# Patient Record
Sex: Female | Born: 1985
Health system: Southern US, Community
[De-identification: ages and names within clinical notes are randomized; demographics above are authoritative.]

## PROBLEM LIST (undated history)

## (undated) ENCOUNTER — Inpatient Hospital Stay (HOSPITAL_COMMUNITY): Payer: Self-pay

## (undated) DIAGNOSIS — O139 Gestational [pregnancy-induced] hypertension without significant proteinuria, unspecified trimester: Secondary | ICD-10-CM

## (undated) DIAGNOSIS — Z789 Other specified health status: Secondary | ICD-10-CM

## (undated) DIAGNOSIS — I10 Essential (primary) hypertension: Secondary | ICD-10-CM

## (undated) DIAGNOSIS — Z87891 Personal history of nicotine dependence: Secondary | ICD-10-CM

## (undated) DIAGNOSIS — G4730 Sleep apnea, unspecified: Secondary | ICD-10-CM

## (undated) DIAGNOSIS — D649 Anemia, unspecified: Secondary | ICD-10-CM

## (undated) DIAGNOSIS — F1091 Alcohol use, unspecified, in remission: Secondary | ICD-10-CM

## (undated) HISTORY — PX: DILATION AND CURETTAGE OF UTERUS: SHX78

## (undated) HISTORY — DX: Other specified health status: Z78.9

## (undated) HISTORY — DX: Essential (primary) hypertension: I10

## (undated) HISTORY — PX: WISDOM TOOTH EXTRACTION: SHX21

## (undated) HISTORY — DX: Alcohol use, unspecified, in remission: F10.91

## (undated) HISTORY — DX: Personal history of nicotine dependence: Z87.891

---
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
## 2003-10-19 ENCOUNTER — Inpatient Hospital Stay (HOSPITAL_COMMUNITY): Admission: AD | Admit: 2003-10-19 | Discharge: 2003-10-19 | Payer: Self-pay | Admitting: *Deleted

## 2003-11-10 ENCOUNTER — Ambulatory Visit (HOSPITAL_COMMUNITY): Admission: RE | Admit: 2003-11-10 | Discharge: 2003-11-10 | Payer: Self-pay | Admitting: *Deleted

## 2004-06-02 ENCOUNTER — Emergency Department (HOSPITAL_COMMUNITY): Admission: EM | Admit: 2004-06-02 | Discharge: 2004-06-02 | Payer: Self-pay | Admitting: Emergency Medicine

## 2004-10-24 ENCOUNTER — Emergency Department (HOSPITAL_COMMUNITY): Admission: EM | Admit: 2004-10-24 | Discharge: 2004-10-25 | Payer: Self-pay | Admitting: Emergency Medicine

## 2005-08-27 ENCOUNTER — Emergency Department (HOSPITAL_COMMUNITY): Admission: EM | Admit: 2005-08-27 | Discharge: 2005-08-27 | Payer: Self-pay | Admitting: Family Medicine

## 2005-08-27 ENCOUNTER — Emergency Department (HOSPITAL_COMMUNITY): Admission: EM | Admit: 2005-08-27 | Discharge: 2005-08-27 | Payer: Self-pay | Admitting: *Deleted

## 2005-10-18 ENCOUNTER — Emergency Department (HOSPITAL_COMMUNITY): Admission: EM | Admit: 2005-10-18 | Discharge: 2005-10-18 | Payer: Self-pay | Admitting: Emergency Medicine

## 2006-06-05 ENCOUNTER — Emergency Department (HOSPITAL_COMMUNITY): Admission: EM | Admit: 2006-06-05 | Discharge: 2006-06-06 | Payer: Self-pay | Admitting: Emergency Medicine

## 2006-06-09 ENCOUNTER — Emergency Department (HOSPITAL_COMMUNITY): Admission: EM | Admit: 2006-06-09 | Discharge: 2006-06-09 | Payer: Self-pay | Admitting: Emergency Medicine

## 2006-07-04 ENCOUNTER — Ambulatory Visit: Payer: Self-pay | Admitting: Obstetrics and Gynecology

## 2006-07-04 ENCOUNTER — Encounter: Payer: Self-pay | Admitting: Obstetrics and Gynecology

## 2006-11-30 ENCOUNTER — Emergency Department (HOSPITAL_COMMUNITY): Admission: EM | Admit: 2006-11-30 | Discharge: 2006-12-01 | Payer: Self-pay | Admitting: Emergency Medicine

## 2007-02-27 ENCOUNTER — Ambulatory Visit (HOSPITAL_COMMUNITY): Admission: RE | Admit: 2007-02-27 | Discharge: 2007-02-27 | Payer: Self-pay | Admitting: Family Medicine

## 2007-05-07 ENCOUNTER — Inpatient Hospital Stay (HOSPITAL_COMMUNITY): Admission: AD | Admit: 2007-05-07 | Discharge: 2007-05-08 | Payer: Self-pay | Admitting: Family Medicine

## 2007-05-07 ENCOUNTER — Other Ambulatory Visit: Payer: Self-pay

## 2007-05-24 ENCOUNTER — Ambulatory Visit: Payer: Self-pay | Admitting: Oncology

## 2007-06-19 ENCOUNTER — Ambulatory Visit: Payer: Self-pay | Admitting: Obstetrics and Gynecology

## 2007-06-19 ENCOUNTER — Inpatient Hospital Stay (HOSPITAL_COMMUNITY): Admission: AD | Admit: 2007-06-19 | Discharge: 2007-06-19 | Payer: Self-pay | Admitting: Gynecology

## 2007-07-22 ENCOUNTER — Ambulatory Visit: Payer: Self-pay | Admitting: Family Medicine

## 2007-07-22 ENCOUNTER — Inpatient Hospital Stay (HOSPITAL_COMMUNITY): Admission: RE | Admit: 2007-07-22 | Discharge: 2007-07-25 | Payer: Self-pay | Admitting: Family Medicine

## 2007-07-29 ENCOUNTER — Inpatient Hospital Stay (HOSPITAL_COMMUNITY): Admission: AD | Admit: 2007-07-29 | Discharge: 2007-07-29 | Payer: Self-pay | Admitting: Family Medicine

## 2007-08-27 ENCOUNTER — Emergency Department (HOSPITAL_COMMUNITY): Admission: EM | Admit: 2007-08-27 | Discharge: 2007-08-27 | Payer: Self-pay | Admitting: Emergency Medicine

## 2008-02-22 ENCOUNTER — Emergency Department (HOSPITAL_COMMUNITY): Admission: EM | Admit: 2008-02-22 | Discharge: 2008-02-22 | Payer: Self-pay | Admitting: Emergency Medicine

## 2008-08-27 ENCOUNTER — Emergency Department (HOSPITAL_COMMUNITY): Admission: EM | Admit: 2008-08-27 | Discharge: 2008-08-27 | Payer: Self-pay | Admitting: Emergency Medicine

## 2008-09-03 ENCOUNTER — Emergency Department (HOSPITAL_COMMUNITY): Admission: EM | Admit: 2008-09-03 | Discharge: 2008-09-03 | Payer: Self-pay | Admitting: Emergency Medicine

## 2008-09-09 ENCOUNTER — Emergency Department (HOSPITAL_COMMUNITY): Admission: EM | Admit: 2008-09-09 | Discharge: 2008-09-09 | Payer: Self-pay | Admitting: Emergency Medicine

## 2008-09-23 ENCOUNTER — Emergency Department (HOSPITAL_COMMUNITY): Admission: EM | Admit: 2008-09-23 | Discharge: 2008-09-23 | Payer: Self-pay | Admitting: Emergency Medicine

## 2008-10-20 ENCOUNTER — Emergency Department (HOSPITAL_COMMUNITY): Admission: EM | Admit: 2008-10-20 | Discharge: 2008-10-20 | Payer: Self-pay | Admitting: Emergency Medicine

## 2008-12-31 ENCOUNTER — Emergency Department (HOSPITAL_COMMUNITY): Admission: EM | Admit: 2008-12-31 | Discharge: 2008-12-31 | Payer: Self-pay | Admitting: Emergency Medicine

## 2009-04-06 ENCOUNTER — Inpatient Hospital Stay (HOSPITAL_COMMUNITY): Admission: AD | Admit: 2009-04-06 | Discharge: 2009-04-06 | Payer: Self-pay | Admitting: Obstetrics & Gynecology

## 2009-05-16 ENCOUNTER — Emergency Department (HOSPITAL_COMMUNITY): Admission: EM | Admit: 2009-05-16 | Discharge: 2009-05-16 | Payer: Self-pay | Admitting: Emergency Medicine

## 2010-12-18 ENCOUNTER — Encounter: Payer: Self-pay | Admitting: *Deleted

## 2011-02-01 ENCOUNTER — Emergency Department (HOSPITAL_COMMUNITY)
Admission: EM | Admit: 2011-02-01 | Discharge: 2011-02-01 | Disposition: A | Discharge status: Home / Self Care | Payer: BC Managed Care – PPO | Attending: Emergency Medicine | Admitting: Emergency Medicine

## 2011-02-01 ENCOUNTER — Emergency Department (HOSPITAL_COMMUNITY): Payer: BC Managed Care – PPO

## 2011-02-01 DIAGNOSIS — R079 Chest pain, unspecified: Secondary | ICD-10-CM | POA: Insufficient documentation

## 2011-02-01 DIAGNOSIS — H538 Other visual disturbances: Secondary | ICD-10-CM | POA: Insufficient documentation

## 2011-02-01 DIAGNOSIS — R51 Headache: Secondary | ICD-10-CM | POA: Insufficient documentation

## 2011-02-01 DIAGNOSIS — R0602 Shortness of breath: Secondary | ICD-10-CM | POA: Insufficient documentation

## 2011-02-01 LAB — DIFFERENTIAL
Basophils Absolute: 0 10*3/uL (ref 0.0–0.1)
Basophils Relative: 1 % (ref 0–1)
Eosinophils Absolute: 0.2 10*3/uL (ref 0.0–0.7)
Eosinophils Relative: 3 % (ref 0–5)
Lymphocytes Relative: 46 % (ref 12–46)
Lymphs Abs: 2.9 10*3/uL (ref 0.7–4.0)
Monocytes Absolute: 0.5 10*3/uL (ref 0.1–1.0)
Monocytes Relative: 7 % (ref 3–12)
Neutro Abs: 2.8 10*3/uL (ref 1.7–7.7)
Neutrophils Relative %: 44 % (ref 43–77)

## 2011-02-01 LAB — POCT I-STAT, CHEM 8
BUN: 9 mg/dL (ref 6–23)
Calcium, Ion: 1.14 mmol/L (ref 1.12–1.32)
Chloride: 106 mEq/L (ref 96–112)
Creatinine, Ser: 1 mg/dL (ref 0.4–1.2)
Glucose, Bld: 84 mg/dL (ref 70–99)
HCT: 37 % (ref 36.0–46.0)
Hemoglobin: 12.6 g/dL (ref 12.0–15.0)
Potassium: 3.8 mEq/L (ref 3.5–5.1)
Sodium: 139 mEq/L (ref 135–145)
TCO2: 22 mmol/L (ref 0–100)

## 2011-02-01 LAB — CBC
HCT: 36.3 % (ref 36.0–46.0)
Hemoglobin: 10.9 g/dL — ABNORMAL LOW (ref 12.0–15.0)
MCH: 23.2 pg — ABNORMAL LOW (ref 26.0–34.0)
MCHC: 30 g/dL (ref 30.0–36.0)
MCV: 77.2 fL — ABNORMAL LOW (ref 78.0–100.0)
Platelets: 370 10*3/uL (ref 150–400)
RBC: 4.7 MIL/uL (ref 3.87–5.11)
RDW: 16.2 % — ABNORMAL HIGH (ref 11.5–15.5)
WBC: 6.5 10*3/uL (ref 4.0–10.5)

## 2011-02-01 LAB — POCT CARDIAC MARKERS
CKMB, poc: <1 ng/mL — ABNORMAL LOW (ref 1.0–8.0)
Myoglobin, poc: 45.2 ng/mL (ref 12–200)
Troponin i, poc: <0.05 ng/mL (ref 0.00–0.09)

## 2011-02-01 LAB — BASIC METABOLIC PANEL
BUN: 9 mg/dL (ref 6–23)
CO2: 26 mEq/L (ref 19–32)
Calcium: 8.9 mg/dL (ref 8.4–10.5)
Chloride: 107 mEq/L (ref 96–112)
Creatinine, Ser: 0.89 mg/dL (ref 0.4–1.2)
GFR calc Af Amer: >60 mL/min (ref >60)
GFR calc non Af Amer: >60 mL/min (ref >60)
Glucose, Bld: 83 mg/dL (ref 70–99)
Potassium: 3.7 mEq/L (ref 3.5–5.1)
Sodium: 138 mEq/L (ref 135–145)

## 2011-02-01 LAB — PREGNANCY, URINE: Preg Test, Ur: NEGATIVE

## 2011-02-01 LAB — URINALYSIS, ROUTINE W REFLEX MICROSCOPIC
Bilirubin Urine: NEGATIVE
Glucose, UA: NEGATIVE mg/dL
Hgb urine dipstick: NEGATIVE
Leukocytes, UA: NEGATIVE
Nitrite: NEGATIVE
Protein, ur: NEGATIVE mg/dL
Specific Gravity, Urine: 1.033 — ABNORMAL HIGH (ref 1.005–1.030)
Urobilinogen, UA: 0.2 mg/dL (ref 0.0–1.0)
pH: 5.5 (ref 5.0–8.0)

## 2011-02-01 LAB — URINE MICROSCOPIC-ADD ON

## 2011-03-06 LAB — RAPID STREP SCREEN (MED CTR MEBANE ONLY): Streptococcus, Group A Screen (Direct): NEGATIVE

## 2011-03-07 LAB — CBC
HCT: 37.3 % (ref 36.0–46.0)
Hemoglobin: 12.7 g/dL (ref 12.0–15.0)
MCHC: 34.2 g/dL (ref 30.0–36.0)
MCV: 83.3 fL (ref 78.0–100.0)
Platelets: 336 10*3/uL (ref 150–400)
RBC: 4.48 MIL/uL (ref 3.87–5.11)
RDW: 14.5 % (ref 11.5–15.5)
WBC: 8.5 10*3/uL (ref 4.0–10.5)

## 2011-03-07 LAB — WET PREP, GENITAL
Trich, Wet Prep: NONE SEEN
Yeast Wet Prep HPF POC: NONE SEEN

## 2011-03-07 LAB — HCG, QUANTITATIVE, PREGNANCY: hCG, Beta Chain, Quant, S: <2 m[IU]/mL (ref <5)

## 2011-03-07 LAB — GC/CHLAMYDIA PROBE AMP, GENITAL: Chlamydia, DNA Probe: NEGATIVE

## 2011-04-11 NOTE — Discharge Summary (Signed)
[name], [name]              ACCOUNT NO.:  [id number]   MEDICAL RECORD NO.:  [id number]          PATIENT TYPE:  INP   LOCATION:  9128                          [facility]ACILITY:  [facility]   PHYSICIAN:  Tanya S. Shawnie Pons, M.D.   DATE OF BIRTH:  November 14, 1986   DATE OF ADMISSION:  07/22/2007  DATE OF DISCHARGE:  07/25/2007                               DISCHARGE SUMMARY   ADMITTING DIAGNOSES:  1. Intrauterine pregnancy at 39 weeks and 2 days gestation.  2. History of previous low transverse cesarean section.   DISCHARGE DIAGNOSIS:  Postoperative day three status post uncomplicated  low transverse cesarean section that was a repeat procedure.   HOSPITAL COURSE:  The patient was admitted on July 22, 2007, for a  repeat low transverse C-section.  Indication for this procedure was a  previous low transverse C-section.  The patient underwent spinal  anesthesia for this procedure.  Procedure produced a viable female  infant weighing 7 pounds 10 ounces with Apgar's of 9 and 9, amniotic  fluid was noted to be clear, and female anatomy was noted to be normal.  Estimated blood loss was 800 ml.  Procedure was uncomplicated, and the  patient tolerated the procedure well, was transferred to the recovery  room in stable and excellent condition.  Postoperatively, the patient  underwent an uncomplicated hospital course, recovered well.  Postoperative CBC performed on August 26th after the procedure was  notable for a hemoglobin of 7.8 down from a preoperative hemoglobin on  the 21st of August of 9.4.  Hemoglobin was repeated on postoperative day  one at approximately 11:40 a.m. and was noted to be 8.3.  Patient  remained asymptomatic throughout her postoperative course, and  hemoglobin was not repeated as there was no indication for this and the  hemoglobin had responded appropriately after surgery.  On postoperative  day three, the patient was ambulating well, tolerating good oral intake,  and had a benign  physical exam.  Staples were in place, incision was  clean, dry, and intact with no signs of erythema or discharge.  On  postoperative day three, patient and infant were noted to be stable and  ready for discharge to home.   SIGNIFICANT FINDINGS:  Preoperative CBC on August 21st was notable for a  hemoglobin of 9.4, a white blood cell count 7.0, and a platelet count of  326.  Postoperative hemoglobin on August 26th in the morning was noted  to have a hemoglobin of 7.8, a platelet count of 256, and a white blood  cell count of 8.1.  This was repeated later that same day at 11:40 in  the morning, and hemoglobin was noted to be 8.3, platelet count 326, and  a white blood cell count of 9.3.   DISCHARGE MEDICATIONS:  1. Percocet 5/325 one to two tabs every 4 to 6 hours p.r.n. pain.  2. Motrin 600 mg p.o. q.6h p.r.n. pain.  3. Colace 100 mg p.o. b.i.d. p.r.n. constipation.  4. Ferrous sulfate 325 mg p.o. b.i.d. for anemia.  5. Prenatal vitamins once daily.   DISCHARGE INSTRUCTIONS:  1. Patient is  to take medications mentioned previously.  2. Patient is to follow up at [facility] in six      weeks for a routine postpartum visit.  3. Patient is to have the staples removed on postoperative day five to      seven by a [facility] nurse.  4. Patient is to return to [facility] or to clinic earlier if she has any      symptoms of lightheadedness, increased pain, shortness of breath,      drainage or redness around the incision site, or any other symptoms      that are concerning for this patient.  5. Patient is discharged home in stable condition.   DISCHARGE CONDITION:  Stable, excellent.      Myrtie Soman, MD      Shelbie Proctor. Shawnie Pons, M.D.  Electronically Signed    TE/MEDQ  D:  07/25/2007  T:  07/25/2007  Job:  [id number]

## 2011-04-11 NOTE — Op Note (Signed)
[name], [name]              ACCOUNT NO.:  [id number]   MEDICAL RECORD NO.:  [id number]          PATIENT TYPE:  INP   LOCATION:  9128                          [facility]   PHYSICIAN:  Tanya S. Shawnie Pons, M.D.   DATE OF BIRTH:  [birth year]   DATE OF PROCEDURE:  07/22/2007  DATE OF DISCHARGE:                               OPERATIVE REPORT   PREOPERATIVE DIAGNOSES:  1. Intrauterine pregnancy at 39 weeks, 2 days.  2. History of previous low transverse cesarean section.   POSTOPERATIVE DIAGNOSES:  1. Intrauterine pregnancy at 39 weeks, 2 days.  2. History of previous low transverse cesarean section.   PROCEDURE:  Repeat low transverse cesarean section due to patient desire  after history of primary low transverse cesarean section.   SURGEON:  Shelbie Proctor. Shawnie Pons, M.D.   ASSISTANT:  Karlton Lemon, M.D.   ANESTHESIA:  Spinal.   FINDINGS:  1. A viable infant female weighing 7 pounds, 10 ounces with Apgars of      9 at one minute and 9 at five minutes.  2. Clear amniotic fluid.  3. Normal female pelvic anatomy.   SPECIMENS:  Cord blood, placenta.   ESTIMATED BLOOD LOSS:  800 ml.   FLUIDS:  Lactated Ringer's 3300 ml.   DRAINS:  Foley with 150 ml of urine out.   COMPLICATIONS:  Not immediate.   REASON FOR PROCEDURE:  Repeat low transverse cesarean section at  patient's request due to history of previous low transverse cesarean  section.   DESCRIPTION OF PROCEDURE:  The patient was taken to the operating room  and after obtaining adequate spinal anesthesia, was prepped and draped  in the usual sterile manner in a supine position with a left lateral  tilt.  After insuring adequate anesthesia, a Pfannenstiel skin incision  was made using a scalpel.  The incision was carried down through the  subcutaneous tissue using the scalpel.  The rectus fascia was nicked in  the midline and extended laterally in each direction using the Mayo  scissors.  The rectus muscle was dissected and  separated free of the  fascia in the midline using blunt dissection.  The parietal peritoneum  was identified, grasped with a hemostat, and entered bluntly.  At that  point, a bladder blade was placed.  A low transverse uterine incision  was made using a scalpel, and the incision was extended laterally and  superiorly using blunt dissection.  A hand was placed within the uterine  cavity and used to elevate and flex the head of the infant, which was  delivered without difficulty.  The infant was bulb-suctioned.  The cord  was doubly clamped and cut, and the infant handed to the nursery team in  attendance.  Specimens were collected for cord blood.  The placenta was  delivered with external uterine massage and appeared intact.  The uterus  was then cleared with a dry laparotomy sponge and wiped free of any  traces of membranes.  The edges of the uterine incision were grasped  with ring clamps, as well as at the anterior and posterior margins in  the midline.  The uterine incision was closed in one running, locked  layer with 0 Vicryl.  Small areas of bleeding were controlled using  figure-of-eight stitches of 0 Vicryl x1.  The uterine closure was  inspected and found to have adequate hemostasis.  The fascia was then  closed with one suture of 0 Vicryl beginning left laterally and  extending to the right lateral side.  Small areas of bleeding in the  subcutaneous tissue were controlled using the Bovie cautery.  The skin  was reapproximated with stainless steel skin staples.  Marcaine 0.25% 3  ml was injected into the subcutaneous tissue.  Sponge and needle counts  were correct x2.  Patient tolerated the procedure well and went to the  post anesthesia care unit in stable condition.      Karlton Lemon, MD  Electronically Signed     ______________________________  Shelbie Proctor. Shawnie Pons, M.D.    NS/MEDQ  D:  07/22/2007  T:  07/22/2007  Job:  [id number]

## 2011-04-14 NOTE — Group Therapy Note (Signed)
NAMESHARETHA, NEWSON              ACCOUNT NO.:  [id number]   MEDICAL RECORD NO.:  [id number]          PATIENT TYPE:  WOC   LOCATION:  [facility]                   FACILITY:  [facility]   PHYSICIAN:  Argentina Donovan, MD        DATE OF BIRTH:  1986/02/14   DATE OF SERVICE:  07/04/2006                                    CLINIC NOTE   The patient is a 25 year old gravida 1 para 1-0-0-1 who was seen on June 07, 2006 in the emergency room at [facility] with severe abrupt onset  lower abdominal pain found to be ruptured small ovarian cyst on the left  side which they requested a followup in 6 weeks and examination.  She is  asymptomatic now.  Abdomen is soft and nontender, no mass, no organomegaly.  External genitalia is normal.  BUS within normal limits.  Vagina is clean  and well rugated.  The uterus and adnexa could not be palpated because of  habitus of the patient and Pap smear was taken.   IMPRESSION:  Followup post rupture of ovarian cyst.           ______________________________  Argentina Donovan, MD     PR/MEDQ  D:  07/04/2006  T:  07/04/2006  Job:  [id number]

## 2011-08-03 ENCOUNTER — Encounter (HOSPITAL_COMMUNITY): Payer: Self-pay

## 2011-08-03 ENCOUNTER — Inpatient Hospital Stay (HOSPITAL_COMMUNITY)
Admission: AD | Admit: 2011-08-03 | Discharge: 2011-08-03 | Disposition: A | Discharge status: Home / Self Care | Payer: BC Managed Care – PPO | Source: Ambulatory Visit | Attending: Obstetrics & Gynecology | Admitting: Obstetrics & Gynecology

## 2011-08-03 ENCOUNTER — Inpatient Hospital Stay (HOSPITAL_COMMUNITY): Payer: BC Managed Care – PPO

## 2011-08-03 DIAGNOSIS — O209 Hemorrhage in early pregnancy, unspecified: Secondary | ICD-10-CM

## 2011-08-03 HISTORY — DX: Other specified health status: Z78.9

## 2011-08-03 LAB — CBC
MCH: 23 pg — ABNORMAL LOW (ref 26.0–34.0)
MCHC: 30.4 g/dL (ref 30.0–36.0)
Platelets: 345 10*3/uL (ref 150–400)
RBC: 4.43 MIL/uL (ref 3.87–5.11)

## 2011-08-03 LAB — URINE MICROSCOPIC-ADD ON

## 2011-08-03 LAB — URINALYSIS, ROUTINE W REFLEX MICROSCOPIC
Nitrite: NEGATIVE
Specific Gravity, Urine: >1.03 — ABNORMAL HIGH (ref 1.005–1.030)
Urobilinogen, UA: 1 mg/dL (ref 0.0–1.0)

## 2011-08-03 LAB — WET PREP, GENITAL: Yeast Wet Prep HPF POC: NONE SEEN

## 2011-08-03 LAB — POCT PREGNANCY, URINE: Preg Test, Ur: POSITIVE

## 2011-08-03 LAB — ABO/RH: ABO/RH(D): O POS

## 2011-08-03 NOTE — Progress Notes (Signed)
Pt states she missed her period in August. Started bleeding again on 9-1 and saw the nurse at her job today. Had a POS UPT. Has also had cramping with the bleeding.

## 2011-08-03 NOTE — ED Provider Notes (Signed)
History   Pt presents today c/o vag bleeding in preg. She states she missed her period in Aug and then began to have what she thought was a NL period on 9/1. However, she had a positive preg test at her job. She reports mild lower abd cramping. She denies fever or any other sx at this time.  Chief Complaint  Patient presents with  . Vaginal Bleeding   HPI  OB History    Grav Para Term Preterm Abortions TAB SAB Ect Mult Living   5 2 2  2 2    2       History reviewed. No pertinent past medical history.  History reviewed. No pertinent past surgical history.  No family history on file.  History  Substance Use Topics  . Smoking status: Not on file  . Smokeless tobacco: Not on file  . Alcohol Use: Not on file    Allergies: No Known Allergies  Prescriptions prior to admission  Medication Sig Dispense Refill  . PRESCRIPTION MEDICATION For tooth ache         Review of Systems  Constitutional: Negative for fever.  Cardiovascular: Negative for chest pain.  Gastrointestinal: Positive for abdominal pain. Negative for nausea, vomiting, diarrhea and constipation.  Genitourinary: Negative for dysuria, urgency, frequency and hematuria.  Neurological: Negative for dizziness and headaches.  Psychiatric/Behavioral: Negative for depression and suicidal ideas.   Physical Exam   Blood pressure 137/83, pulse 93, temperature 98.9 F (37.2 C), temperature source Oral, resp. rate 20, height 5\' 6"  (1.676 m), weight 289 lb 6.4 oz (131.271 kg), last menstrual period 06/17/2011, SpO2 97.00%.  Physical Exam  Constitutional: She is oriented to person, place, and time. She appears well-developed and well-nourished. No distress.  HENT:  Head: Normocephalic and atraumatic.  Eyes: EOM are normal. Pupils are equal, round, and reactive to light.  GI: Soft. She exhibits no distension. There is no tenderness. There is no rebound and no guarding.  Genitourinary: There is bleeding around the vagina. No  vaginal discharge found.       Cervix Lg/closed. No adnexal masses. Exam difficult secondary to body habitus.  Neurological: She is alert and oriented to person, place, and time.  Skin: Skin is warm and dry. She is not diaphoretic.  Psychiatric: She has a normal mood and affect. Her behavior is normal. Judgment and thought content normal.    MAU Course  Procedures Wet prep and GC/Chlamydia cultures done.  Results for orders placed during the hospital encounter of 08/03/11 (from the past 24 hour(s))  URINALYSIS, ROUTINE W REFLEX MICROSCOPIC     Status: Abnormal   Collection Time   08/03/11  5:00 PM      Component Value Range   Color, Urine YELLOW  YELLOW    Appearance CLOUDY (*) CLEAR    Specific Gravity, Urine >1.030 (*) 1.005 - 1.030    pH 6.0  5.0 - 8.0    Glucose, UA NEGATIVE  NEGATIVE (mg/dL)   Hgb urine dipstick LARGE (*) NEGATIVE    Bilirubin Urine NEGATIVE  NEGATIVE    Ketones, ur NEGATIVE  NEGATIVE (mg/dL)   Protein, ur NEGATIVE  NEGATIVE (mg/dL)   Urobilinogen, UA 1.0  0.0 - 1.0 (mg/dL)   Nitrite NEGATIVE  NEGATIVE    Leukocytes, UA NEGATIVE  NEGATIVE   URINE MICROSCOPIC-ADD ON     Status: Abnormal   Collection Time   08/03/11  5:00 PM      Component Value Range   Squamous Epithelial / LPF  FEW (*) RARE    WBC, UA 7-10  <3 (WBC/hpf)   RBC / HPF TOO NUMEROUS TO COUNT  <3 (RBC/hpf)   Bacteria, UA FEW (*) RARE    Urine-Other MUCOUS PRESENT    POCT PREGNANCY, URINE     Status: Normal   Collection Time   08/03/11  5:20 PM      Component Value Range   Preg Test, Ur POSITIVE    WET PREP, GENITAL     Status: Abnormal   Collection Time   08/03/11  5:40 PM      Component Value Range   Yeast, Wet Prep NONE SEEN  NONE SEEN    Trich, Wet Prep NONE SEEN  NONE SEEN    Clue Cells, Wet Prep FEW (*) NONE SEEN    WBC, Wet Prep HPF POC FEW (*) NONE SEEN   HCG, QUANTITATIVE, PREGNANCY     Status: Abnormal   Collection Time   08/03/11  5:52 PM      Component Value Range   hCG, Beta  Chain, Quant, S 124 (*) <5 (mIU/mL)  CBC     Status: Abnormal   Collection Time   08/03/11  5:52 PM      Component Value Range   WBC 7.5  4.0 - 10.5 (K/uL)   RBC 4.43  3.87 - 5.11 (MIL/uL)   Hemoglobin 10.2 (*) 12.0 - 15.0 (g/dL)   HCT 14.7 (*) 82.9 - 46.0 (%)   MCV 75.8 (*) 78.0 - 100.0 (fL)   MCH 23.0 (*) 26.0 - 34.0 (pg)   MCHC 30.4  30.0 - 36.0 (g/dL)   RDW 56.2 (*) 13.0 - 15.5 (%)   Platelets 345  150 - 400 (K/uL)  ABO/RH     Status: Normal   Collection Time   08/03/11  5:53 PM      Component Value Range   ABO/RH(D) O POS     US Ob Comp Less 14 Wks  08/03/2011  *RADIOLOGY REPORT*  Clinical Data: Vaginal bleeding for 1 week.  EDC by LMP 03/23/2012  OBSTETRIC <14 WK [place] AND TRANSVAGINAL OB US  Technique:  Both transabdominal and transvaginal ultrasound examinations were performed for complete evaluation of the gestation as well as the maternal uterus, adnexal regions, and pelvic cul-de-sac.  Transvaginal technique was performed to assess early pregnancy.  Comparison:  None.  Intrauterine gestational sac:  None Yolk sac: None Embryo: None  Maternal uterus/adnexae: The ovaries have a normal appearance.  No adnexal mass identified. Exam is technically difficult given the patient body habitus.  No adnexal mass identified.  Correlation with quantitative beta HCG is recommended.  Follow-up ultrasound is recommended depending on quantitative beta HCG values.  IMPRESSION:  1.  No evidence for intrauterine or adnexal pregnancy. Differential diagnosis includes early intrauterine pregnancy or ectopic pregnancy. 2.  Serial quantitative beta HCGs and follow-up ultrasound recommended.  See above.  Original Report Authenticated By: Patterson Hammersmith, M.D.   US Ob Transvaginal  08/03/2011  *RADIOLOGY REPORT*  Clinical Data: Vaginal bleeding for 1 week.  EDC by LMP 03/23/2012  OBSTETRIC <14 WK [place] AND TRANSVAGINAL OB US  Technique:  Both transabdominal and transvaginal ultrasound examinations were performed for  complete evaluation of the gestation as well as the maternal uterus, adnexal regions, and pelvic cul-de-sac.  Transvaginal technique was performed to assess early pregnancy.  Comparison:  None.  Intrauterine gestational sac:  None Yolk sac: None Embryo: None  Maternal uterus/adnexae: The ovaries have a normal appearance.  No adnexal mass identified. Exam is technically difficult given the patient body habitus.  No adnexal mass identified.  Correlation with quantitative beta HCG is recommended.  Follow-up ultrasound is recommended depending on quantitative beta HCG values.  IMPRESSION:  1.  No evidence for intrauterine or adnexal pregnancy. Differential diagnosis includes early intrauterine pregnancy or ectopic pregnancy. 2.  Serial quantitative beta HCGs and follow-up ultrasound recommended.  See above.  Original Report Authenticated By: Patterson Hammersmith, M.D.     Assessment and Plan  Bleeding in preg: pt will return in 2 days for repeat B-quant. Discussed diet, activity, risks, and precautions.  Clinton Gallant. Khanh Cordner III, DrHSc, MPAS, PA-C  08/03/2011, 5:32 PM   Henrietta Hoover, PA 08/03/11 [id number]

## 2011-08-05 ENCOUNTER — Inpatient Hospital Stay (HOSPITAL_COMMUNITY)
Admission: AD | Admit: 2011-08-05 | Discharge: 2011-08-05 | Disposition: A | Discharge status: Home / Self Care | Payer: BC Managed Care – PPO | Source: Ambulatory Visit | Attending: Obstetrics & Gynecology | Admitting: Obstetrics & Gynecology

## 2011-08-05 DIAGNOSIS — O0281 Inappropriate change in quantitative human chorionic gonadotropin (hCG) in early pregnancy: Secondary | ICD-10-CM | POA: Insufficient documentation

## 2011-08-05 LAB — HCG, QUANTITATIVE, PREGNANCY: hCG, Beta Chain, Quant, S: 157 m[IU]/mL — ABNORMAL HIGH (ref <5)

## 2011-08-05 NOTE — ED Provider Notes (Signed)
[name] is a 25 y.o. female who presents to [facility]U for recheck of Bhcg. Two days ago the bhcg was 124 and  Ultrasound showed no IUGS or adnexal mass. Today there is a slight rise up to 157. Discussed with patient that this is not a normal rise but we will repeat it in 2 days before making a decision as to what needs to be done. Ectopic precautions given.   [place], [place] 08/05/11 [id number]

## 2011-08-05 NOTE — Progress Notes (Signed)
Patient still bleeding like a period, lower abdominal cramping. Here for BHCG

## 2011-09-04 ENCOUNTER — Inpatient Hospital Stay (HOSPITAL_COMMUNITY)
Admission: AD | Admit: 2011-09-04 | Discharge: 2011-09-04 | Disposition: A | Discharge status: Home / Self Care | Payer: BC Managed Care – PPO | Source: Ambulatory Visit | Attending: Obstetrics & Gynecology | Admitting: Obstetrics & Gynecology

## 2011-09-04 ENCOUNTER — Inpatient Hospital Stay (HOSPITAL_COMMUNITY): Payer: BC Managed Care – PPO

## 2011-09-04 ENCOUNTER — Encounter (HOSPITAL_COMMUNITY): Payer: Self-pay | Admitting: *Deleted

## 2011-09-04 DIAGNOSIS — O99891 Other specified diseases and conditions complicating pregnancy: Secondary | ICD-10-CM | POA: Insufficient documentation

## 2011-09-04 DIAGNOSIS — O209 Hemorrhage in early pregnancy, unspecified: Secondary | ICD-10-CM

## 2011-09-04 DIAGNOSIS — R109 Unspecified abdominal pain: Secondary | ICD-10-CM | POA: Insufficient documentation

## 2011-09-04 LAB — URINALYSIS, ROUTINE W REFLEX MICROSCOPIC
Bilirubin Urine: NEGATIVE
Protein, ur: NEGATIVE mg/dL
Urobilinogen, UA: 0.2 mg/dL (ref 0.0–1.0)

## 2011-09-04 LAB — URINE MICROSCOPIC-ADD ON

## 2011-09-04 NOTE — ED Provider Notes (Signed)
History   Pt presents today for follow up from her visit on 08/05/11. She states she was supposed to come back much earlier but did not. She c/o lower abd cramping and mild vag spotting. She denies severe pain, fever, heavy bleeding, or any other sx at this time. Her B-quant was 124 on 08/03/11 and 157 on 08/05/11.  Chief Complaint  Patient presents with  . Abdominal Pain   HPI  OB History    Grav Para Term Preterm Abortions TAB SAB Ect Mult Living   5 2 2  2 2    2       Past Medical History  Diagnosis Date  . No pertinent past medical history     Past Surgical History  Procedure Date  . Cesarean section   . Dilation and curettage of uterus   . Wisdom tooth extraction     No family history on file.  History  Substance Use Topics  . Smoking status: Former Games developer  . Smokeless tobacco: Never Used  . Alcohol Use: No    Allergies: No Known Allergies  Prescriptions prior to admission  Medication Sig Dispense Refill  . Phenylephrine-Acetaminophen (EQL SINUS CONGESTION/PAIN DAY PO) Take 1 tablet by mouth daily as needed. Patient was taking for sinus       . PRESCRIPTION MEDICATION For tooth ache         Review of Systems  Constitutional: Negative for fever.  Cardiovascular: Negative for chest pain.  Gastrointestinal: Positive for abdominal pain. Negative for nausea, vomiting, diarrhea and constipation.  Genitourinary: Negative for dysuria, urgency, frequency and hematuria.  Neurological: Negative for dizziness and headaches.  Psychiatric/Behavioral: Negative for depression and suicidal ideas.   Physical Exam   Blood pressure 129/77, pulse 78, temperature 97.9 F (36.6 C), temperature source Oral, resp. rate 18, height 5' 5.5" (1.664 m), weight 290 lb 4 oz (131.657 kg), last menstrual period 06/17/2011.  Physical Exam  Nursing note and vitals reviewed. Constitutional: She is oriented to person, place, and time. She appears well-developed and well-nourished. No distress.    HENT:  Head: Normocephalic and atraumatic.  Eyes: EOM are normal. Pupils are equal, round, and reactive to light.  GI: Soft. She exhibits no distension. There is no tenderness. There is no rebound and no guarding.  Genitourinary: There is bleeding around the vagina. Vaginal discharge found.       Cervix Lg/closed. No palpable masses. Exam limited secondary to increased body habitus.   Neurological: She is alert and oriented to person, place, and time.  Skin: Skin is warm and dry. She is not diaphoretic.  Psychiatric: She has a normal mood and affect. Her behavior is normal. Judgment and thought content normal.    MAU Course  Procedures  Results for orders placed during the hospital encounter of 09/04/11 (from the past 24 hour(s))  URINALYSIS, ROUTINE W REFLEX MICROSCOPIC     Status: Abnormal   Collection Time   09/04/11  6:23 PM      Component Value Range   Color, Urine YELLOW  YELLOW    Appearance HAZY (*) CLEAR    Specific Gravity, Urine >1.030 (*) 1.005 - 1.030    pH 6.0  5.0 - 8.0    Glucose, UA NEGATIVE  NEGATIVE (mg/dL)   Hgb urine dipstick LARGE (*) NEGATIVE    Bilirubin Urine NEGATIVE  NEGATIVE    Ketones, ur NEGATIVE  NEGATIVE (mg/dL)   Protein, ur NEGATIVE  NEGATIVE (mg/dL)   Urobilinogen, UA 0.2  0.0 -  1.0 (mg/dL)   Nitrite NEGATIVE  NEGATIVE    Leukocytes, UA NEGATIVE  NEGATIVE   URINE MICROSCOPIC-ADD ON     Status: Abnormal   Collection Time   09/04/11  6:23 PM      Component Value Range   Squamous Epithelial / LPF FEW (*) RARE    WBC, UA 0-2  <3 (WBC/hpf)   RBC / HPF 0-2  <3 (RBC/hpf)   Bacteria, UA FEW (*) RARE    Urine-Other MUCOUS PRESENT    HCG, QUANTITATIVE, PREGNANCY     Status: Abnormal   Collection Time   09/04/11  6:41 PM      Component Value Range   hCG, Beta Chain, Quant, S 202 (*) <5 (mIU/mL)    US Ob Comp Less 14 Wks  09/04/2011  *RADIOLOGY REPORT*  Clinical Data: Elevated beta HCG.  Spotting.  OBSTETRIC <14 WK [place] AND TRANSVAGINAL OB US   Technique:  Both transabdominal and transvaginal ultrasound examinations were performed for complete evaluation of the gestation as well as the maternal uterus, adnexal regions, and pelvic cul-de-sac.  Transvaginal technique was performed to assess early pregnancy.  Comparison:  08/03/2011  This is a difficult examination because of patient body habitus.  Intrauterine gestational sac:  There is a small fluid collection within the uterus measuring 4.6 mm in diameter that could represent an early gestational sac. Yolk sac: Not visualized Embryo: Not visualized Cardiac Activity: Not visualized  MSD: 4.6  mm  five    w zero    d  Maternal uterus/adnexae: Detail is limited because of body habitus.  Left ovary measures 2.6 x 1.9 x 1.6 cm and appears normal.  Right ovary measures 3.4 x 1.7 by 1.5 cm and appears normal.  There is no free fluid.  IMPRESSION: Difficult examination because of body habitus.  Small fluid collection within the endometrial region measuring 4.6 mm that could be an early gestational sac.  This would predict a gestational age of [redacted] weeks.  Follow-up is suggested as detail is poor.  Original Report Authenticated By: Thomasenia Sales, M.D.   US Ob Transvaginal  09/04/2011  *RADIOLOGY REPORT*  Clinical Data: Elevated beta HCG.  Spotting.  OBSTETRIC <14 WK [place] AND TRANSVAGINAL OB US  Technique:  Both transabdominal and transvaginal ultrasound examinations were performed for complete evaluation of the gestation as well as the maternal uterus, adnexal regions, and pelvic cul-de-sac.  Transvaginal technique was performed to assess early pregnancy.  Comparison:  08/03/2011  This is a difficult examination because of patient body habitus.  Intrauterine gestational sac:  There is a small fluid collection within the uterus measuring 4.6 mm in diameter that could represent an early gestational sac. Yolk sac: Not visualized Embryo: Not visualized Cardiac Activity: Not visualized  MSD: 4.6  mm  five    w zero    d   Maternal uterus/adnexae: Detail is limited because of body habitus.  Left ovary measures 2.6 x 1.9 x 1.6 cm and appears normal.  Right ovary measures 3.4 x 1.7 by 1.5 cm and appears normal.  There is no free fluid.  IMPRESSION: Difficult examination because of body habitus.  Small fluid collection within the endometrial region measuring 4.6 mm that could be an early gestational sac.  This would predict a gestational age of [redacted] weeks.  Follow-up is suggested as detail is poor.  Original Report Authenticated By: Thomasenia Sales, M.D.    Assessment and Plan  Abnormal preg: it is difficult to say  if this preg represents a new preg since her last visit or simply abnormally rising B-quants. Therefore I discussed with the pt at length the need for adequate follow up. She understood and agreed. She will return on 09/06/11. Discussed diet, activity, risks, and precautions.  Clinton Gallant. Lavren Lewan III, DrHSc, MPAS, PA-C  09/04/2011, 8:10 PM   Henrietta Hoover, PA 09/04/11 2110

## 2011-09-04 NOTE — Progress Notes (Signed)
Pt states was seen here in September for bhcg levels, has had intermittent spotting. Bhcg level taken today at pt's work, told leels were 361. Having lower abd pain, rates 4/10.

## 2011-09-04 NOTE — ED Provider Notes (Signed)
History     CSN: [id number] Arrival date & time: 09/04/2011  5:30 PM  Chief Complaint  Patient presents with  . Abdominal Pain    (Consider location/radiation/quality/duration/timing/severity/associated sxs/prior treatment) HPI [name] is a 25 y.o. female who presents to [facility]U for follow up. She was here 08/01/11 and Bhcg was 124  08/05/11 and had a Bhcg 157. Patient was scheduled to return 9/10 but did not return. Began spotting 3 days ago with mild cramping and did Bhcg at work 10/5 and it was 361. Returns here today for follow up.   Past Medical History  Diagnosis Date  . No pertinent past medical history     Past Surgical History  Procedure Date  . Cesarean section   . Dilation and curettage of uterus   . Wisdom tooth extraction     No family history on file.  History  Substance Use Topics  . Smoking status: Former Games developer  . Smokeless tobacco: Never Used  . Alcohol Use: No    OB History    Grav Para Term Preterm Abortions TAB SAB Ect Mult Living   5 2 2  2 2    2       Review of Systems  Constitutional: Positive for chills and fatigue. Negative for fever and diaphoresis.  HENT: Positive for congestion. Negative for ear pain, sore throat, facial swelling, neck pain, neck stiffness, dental problem and sinus pressure.   Eyes: Negative for photophobia, pain and discharge.  Respiratory: Positive for cough. Negative for chest tightness and wheezing.   Cardiovascular: Negative.   Gastrointestinal: Positive for abdominal pain. Negative for nausea, vomiting, diarrhea, constipation and abdominal distention.  Genitourinary: Positive for urgency, frequency and vaginal bleeding. Negative for dysuria, flank pain, vaginal discharge and difficulty urinating.  Musculoskeletal: Negative for myalgias, back pain and gait problem.  Skin: Negative for color change and rash.  Neurological: Negative for dizziness, speech difficulty, weakness, light-headedness, numbness and headaches.    Psychiatric/Behavioral: Negative for confusion and agitation.    Allergies  Review of patient's allergies indicates no known allergies.  Home Medications  No current outpatient prescriptions on file.  BP 129/77  Pulse 78  Temp(Src) 97.9 F (36.6 C) (Oral)  Resp 18  Ht 5' 5.5" (1.664 m)  Wt 290 lb 4 oz (131.657 kg)  BMI 47.57 kg/m2  LMP 06/17/2011  Physical Exam  Nursing note and vitals reviewed. Constitutional: She is oriented to person, place, and time. She appears well-developed and well-nourished.  Eyes: EOM are normal.  Neck: Neck supple.  Pulmonary/Chest: Effort normal.  Abdominal: Soft. There is no tenderness.  Musculoskeletal: Normal range of motion.  Neurological: She is alert and oriented to person, place, and time. No cranial nerve deficit.  Skin: Skin is warm and dry.    ED Course  Procedures  Results for orders placed during the hospital encounter of 09/04/11 (from the past 24 hour(s))  URINALYSIS, ROUTINE W REFLEX MICROSCOPIC     Status: Abnormal   Collection Time   09/04/11  6:23 PM      Component Value Range   Color, Urine YELLOW  YELLOW    Appearance HAZY (*) CLEAR    Specific Gravity, Urine >1.030 (*) 1.005 - 1.030    pH 6.0  5.0 - 8.0    Glucose, UA NEGATIVE  NEGATIVE (mg/dL)   Hgb urine dipstick LARGE (*) NEGATIVE    Bilirubin Urine NEGATIVE  NEGATIVE    Ketones, ur NEGATIVE  NEGATIVE (mg/dL)   Protein, ur  NEGATIVE  NEGATIVE (mg/dL)   Urobilinogen, UA 0.2  0.0 - 1.0 (mg/dL)   Nitrite NEGATIVE  NEGATIVE    Leukocytes, UA NEGATIVE  NEGATIVE   URINE MICROSCOPIC-ADD ON     Status: Abnormal   Collection Time   09/04/11  6:23 PM      Component Value Range   Squamous Epithelial / LPF FEW (*) RARE    WBC, UA 0-2  <3 (WBC/hpf)   RBC / HPF 0-2  <3 (RBC/hpf)   Bacteria, UA FEW (*) RARE    Urine-Other MUCOUS PRESENT    HCG, QUANTITATIVE, PREGNANCY     Status: Abnormal   Collection Time   09/04/11  6:41 PM      Component Value Range   hCG, Beta  Chain, Quant, S 202 (*) <5 (mIU/mL)    Care turned over to Henrietta Hoover, [facility] at 8:00 pm       [facility], NP 09/04/11 2010

## 2011-09-06 ENCOUNTER — Inpatient Hospital Stay (HOSPITAL_COMMUNITY)
Admission: AD | Admit: 2011-09-06 | Discharge: 2011-09-06 | Disposition: A | Discharge status: Home / Self Care | Payer: BC Managed Care – PPO | Source: Ambulatory Visit | Attending: Family Medicine | Admitting: Family Medicine

## 2011-09-06 DIAGNOSIS — O209 Hemorrhage in early pregnancy, unspecified: Secondary | ICD-10-CM | POA: Insufficient documentation

## 2011-09-06 NOTE — Progress Notes (Signed)
Pt here for f/u bhcg levels, still continues to spot, mild intermittent cramping, rates 3/10.

## 2011-09-06 NOTE — ED Provider Notes (Signed)
[name] is a 25 y.o. who presents to MAU for follow up Bhcg. She had initial visit 08/03/11 and Bhcg was 124, she returned 9/8 and it was 157. She did not return again until 09/04/11 and the Bhcg was 202 and she reported some bleeding. Today the bhcg is 164. The patient desires to be pregnant and wants to do expectant management and return in 48 hours to repeat the Bhcg. She will continue ectopic precautions and return immediately for any problems.  [place], [place] 09/06/11 [phone]

## 2011-09-07 LAB — URINE CULTURE

## 2011-09-07 LAB — POCT URINALYSIS DIP (DEVICE)
Operator id: 247071
Protein, ur: >=300 — AB
Specific Gravity, Urine: 1.025

## 2011-09-07 NOTE — ED Provider Notes (Signed)
Chart reviewed and agree with management and plan.  

## 2011-09-08 LAB — CBC
Hemoglobin: 8.3 — ABNORMAL LOW
MCHC: 33.2
Platelets: 256
RBC: 3.39 — ABNORMAL LOW
RBC: 3.53 — ABNORMAL LOW
RBC: 4.01
RDW: 20.1 — ABNORMAL HIGH
WBC: 8.1

## 2011-09-08 LAB — RPR: RPR Ser Ql: NONREACTIVE

## 2011-09-08 LAB — CCBB MATERNAL DONOR DRAW

## 2011-09-11 ENCOUNTER — Encounter (HOSPITAL_COMMUNITY): Payer: Self-pay | Admitting: Advanced Practice Midwife

## 2011-09-11 ENCOUNTER — Inpatient Hospital Stay (HOSPITAL_COMMUNITY)
Admission: AD | Admit: 2011-09-11 | Discharge: 2011-09-11 | Discharge status: Against Medical Advice | Payer: BC Managed Care – PPO | Source: Ambulatory Visit | Attending: Obstetrics & Gynecology | Admitting: Obstetrics & Gynecology

## 2011-09-11 DIAGNOSIS — O039 Complete or unspecified spontaneous abortion without complication: Secondary | ICD-10-CM

## 2011-09-11 DIAGNOSIS — O200 Threatened abortion: Secondary | ICD-10-CM | POA: Insufficient documentation

## 2011-09-11 LAB — URINALYSIS, ROUTINE W REFLEX MICROSCOPIC
Glucose, UA: NEGATIVE
Specific Gravity, Urine: >1.03 — ABNORMAL HIGH
pH: 6

## 2011-09-11 LAB — WET PREP, GENITAL
Clue Cells Wet Prep HPF POC: NONE SEEN
Trich, Wet Prep: NONE SEEN
Yeast Wet Prep HPF POC: NONE SEEN

## 2011-09-11 NOTE — Plan of Care (Signed)
Pt is not in the lobby when called to triage 

## 2011-09-11 NOTE — Plan of Care (Signed)
Pt is not in the lobby when called to triage. Admitting secretary states the patient is in her car leaving after her labs were drawn.

## 2011-09-11 NOTE — ED Provider Notes (Signed)
History     No chief complaint on file.  HPI Pt here for repeat quant,HCG 202 on 10/8, ? IUGS on u/s, HCG 164 on 10/10. Vaginal bleeding at that time, now only spotting, no pain.   OB History    Grav Para Term Preterm Abortions TAB SAB Ect Mult Living   5 2 2  2 2    2       Past Medical History  Diagnosis Date  . No pertinent past medical history     Past Surgical History  Procedure Date  . Cesarean section   . Dilation and curettage of uterus   . Wisdom tooth extraction     No family history on file.  History  Substance Use Topics  . Smoking status: Former Games developer  . Smokeless tobacco: Never Used  . Alcohol Use: No    Allergies: No Known Allergies  Prescriptions prior to admission  Medication Sig Dispense Refill  . Phenylephrine-Acetaminophen (EQL SINUS CONGESTION/PAIN DAY PO) Take 1 tablet by mouth daily as needed. Patient was taking for sinus       . PRESCRIPTION MEDICATION For tooth ache         Review of Systems  Constitutional: Negative.   Cardiovascular: Negative.   Gastrointestinal: Negative.   Genitourinary:       + spotting, negative for pain   Neurological: Negative.    Physical Exam   Last menstrual period 06/17/2011.  Physical Exam Pt left after having blood drawn, prior to exam.  MAU Course  Procedures Results for orders placed during the hospital encounter of 09/11/11 (from the past 24 hour(s))  HCG, QUANTITATIVE, PREGNANCY     Status: Abnormal   Collection Time   09/11/11  1:53 PM      Component Value Range   hCG, Beta Chain, Quant, S 72 (*) <5 (mIU/mL)     Assessment and Plan  Results phoned to patient, likely SAB, follow up quant in 1 week, ectopic precautions rev'd  Bert Givans 09/11/2011, 2:34 PM

## 2011-09-11 NOTE — ED Provider Notes (Signed)
Attestation of Attending Supervision of Advanced Practitioner: Evaluation and management procedures were performed by the PA/NP/CNM/OB Fellow under my supervision/collaboration. Chart reviewed and agree with management and plan.  Harlon Kutner A 09/11/2011 3:19 PM

## 2011-09-14 LAB — URINALYSIS, ROUTINE W REFLEX MICROSCOPIC
Glucose, UA: NEGATIVE
Protein, ur: NEGATIVE

## 2011-09-14 LAB — WET PREP, GENITAL: Trich, Wet Prep: NONE SEEN

## 2011-09-18 ENCOUNTER — Ambulatory Visit (HOSPITAL_COMMUNITY): Payer: BC Managed Care – PPO

## 2012-03-04 IMAGING — US US OB COMP LESS 14 WK
1 series · 14 of 28 positions shown · non-contrast
Comparison: None.

CLINICAL DATA: Vaginal bleeding for 1 week.  EDC by LMP 03/23/2012

OBSTETRIC <14 WK US AND TRANSVAGINAL OB US
TECHNIQUE: Both transabdominal and transvaginal ultrasound
examinations were performed for complete evaluation of the
gestation as well as the maternal uterus, adnexal regions, and
pelvic cul-de-sac.  Transvaginal technique was performed to assess
early pregnancy.

[Series 1: us ob comp less 14 wks · 14 of 32 slices shown]
[im 2/32]
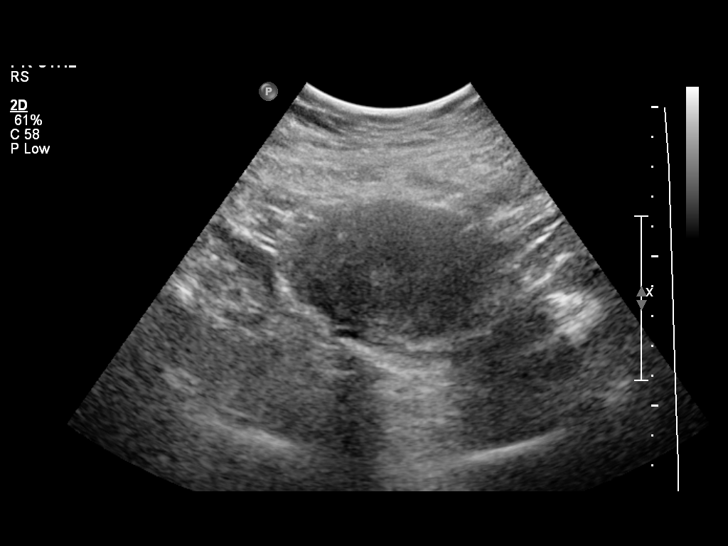
[im 4/32]
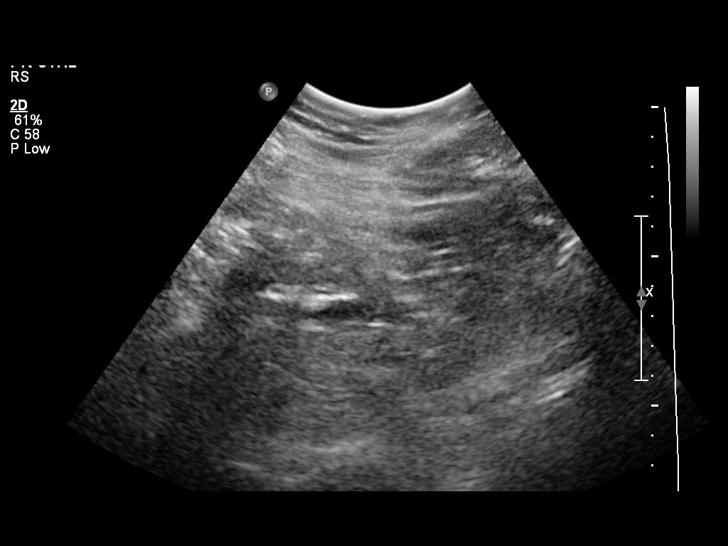
[im 6/32]
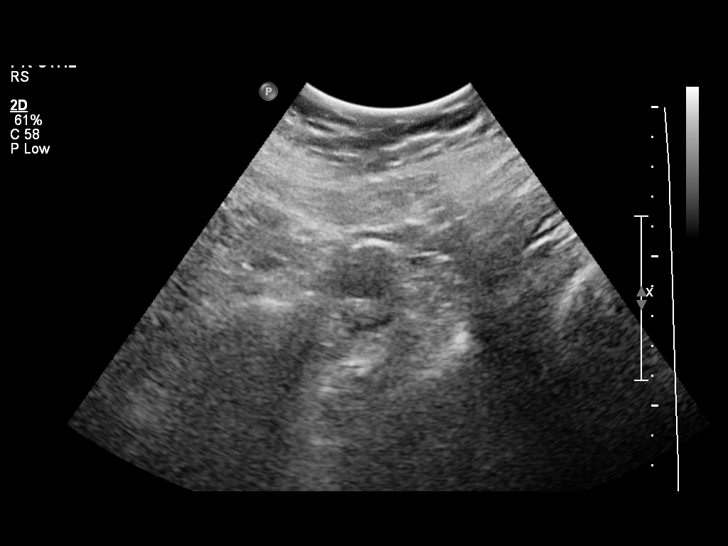
[im 9/32]
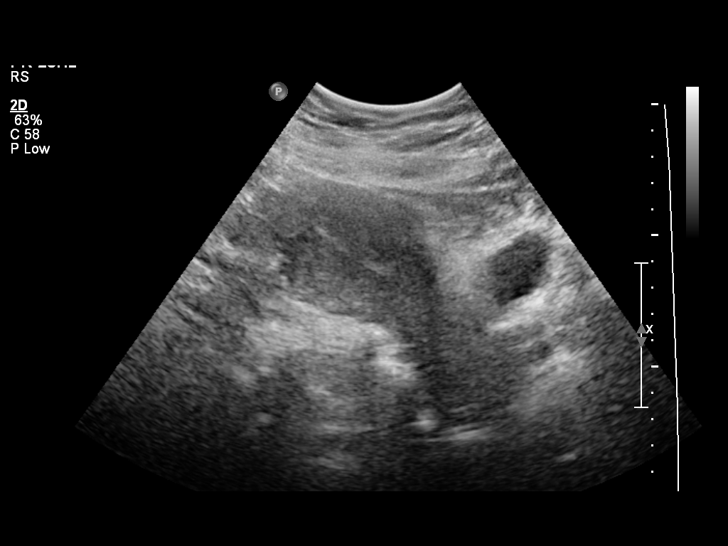
[im 11/32]
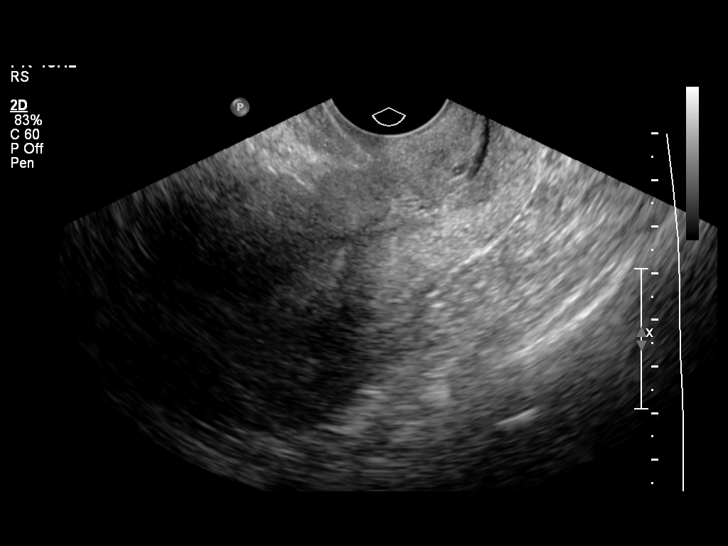
[im 13/32]
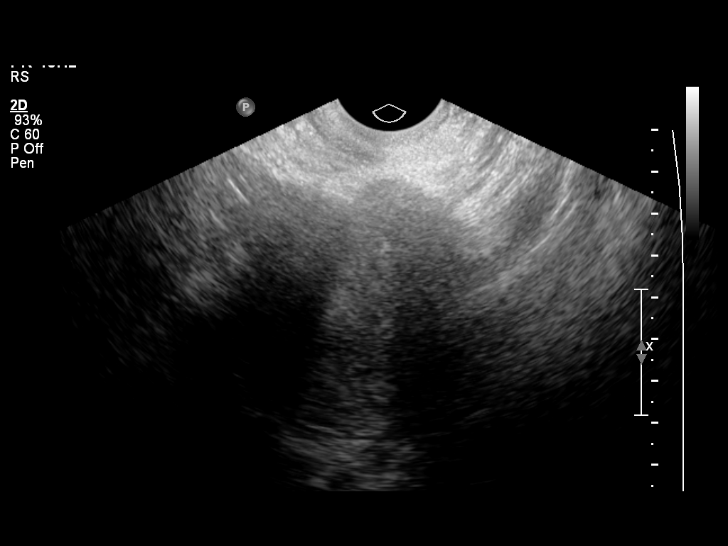
[im 15/32]
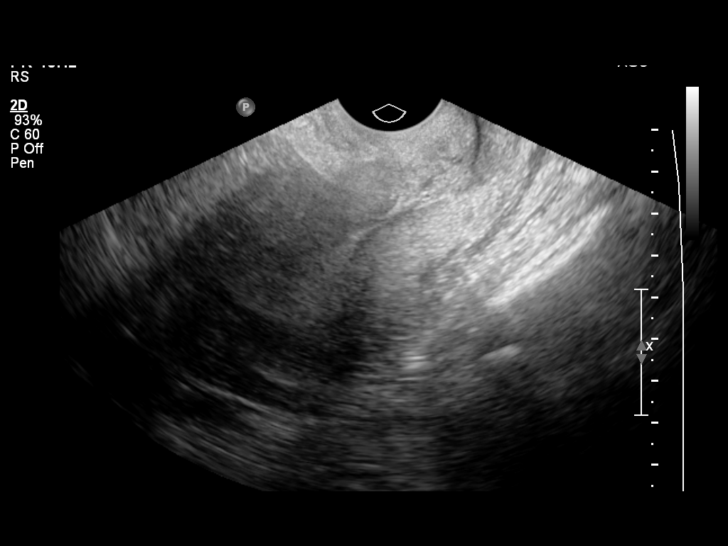
[im 18/32]
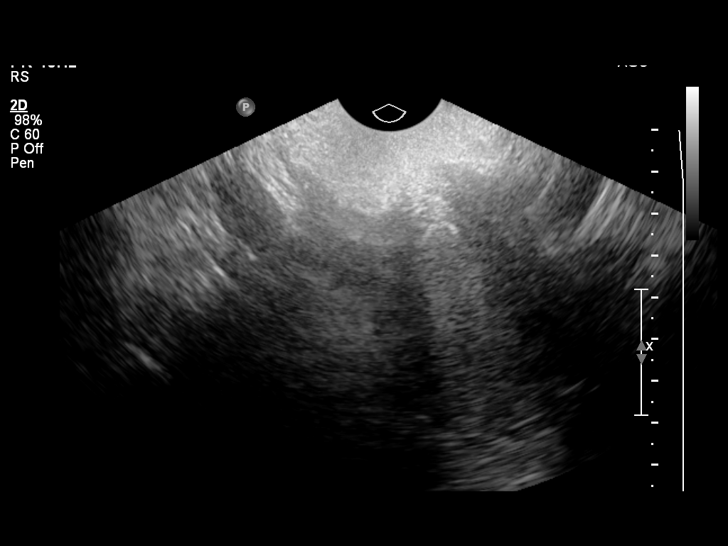
[im 20/32]
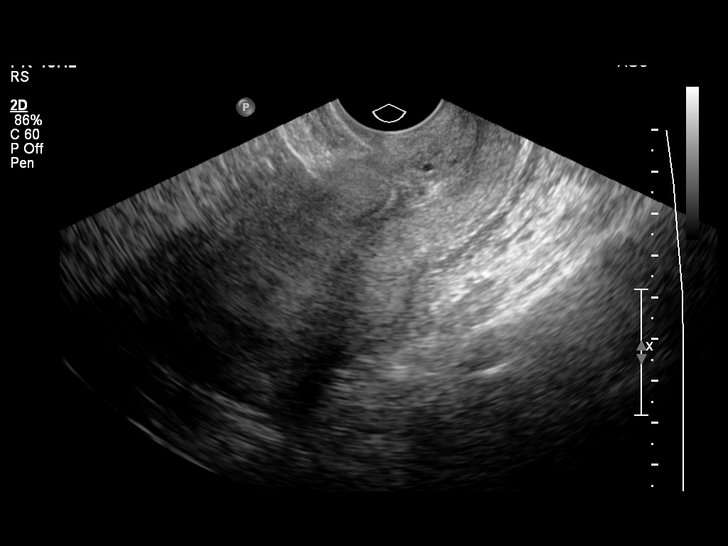
[im 22/32]
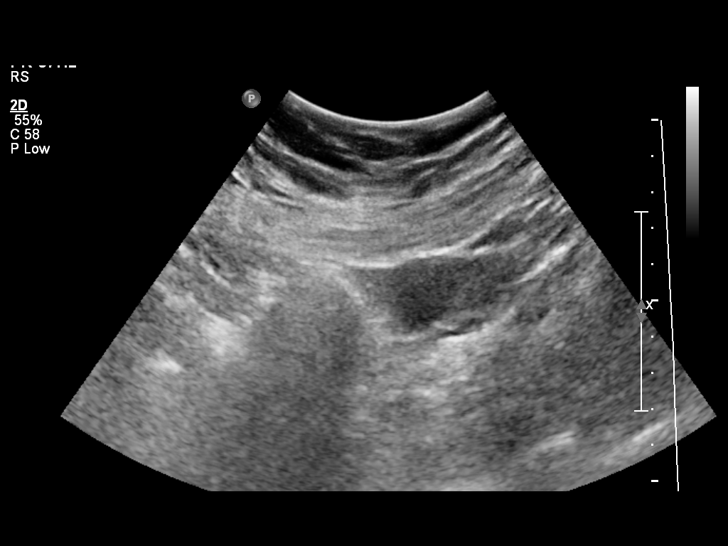
[im 25/32]
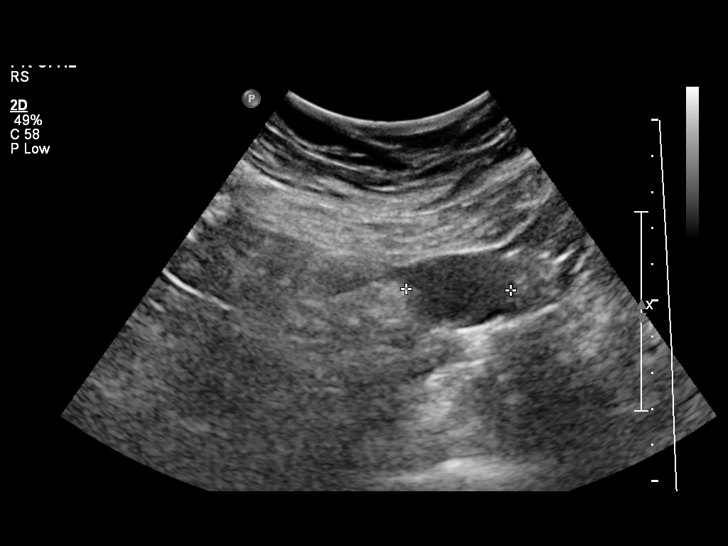
[im 27/32]
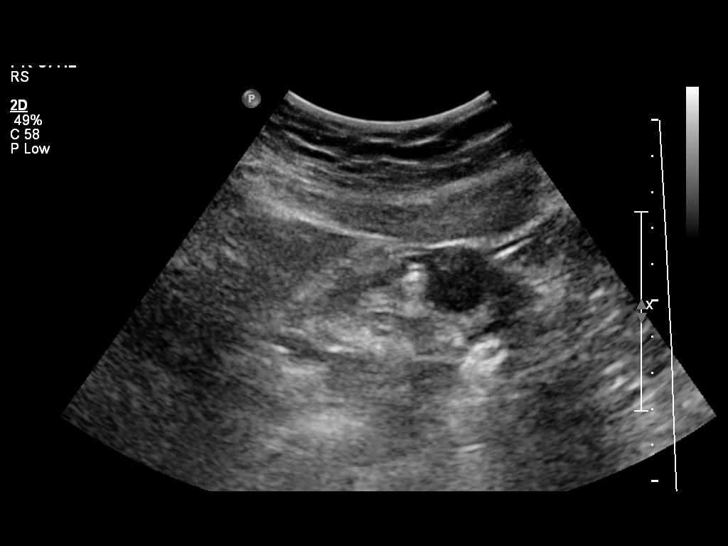
[im 29/32]
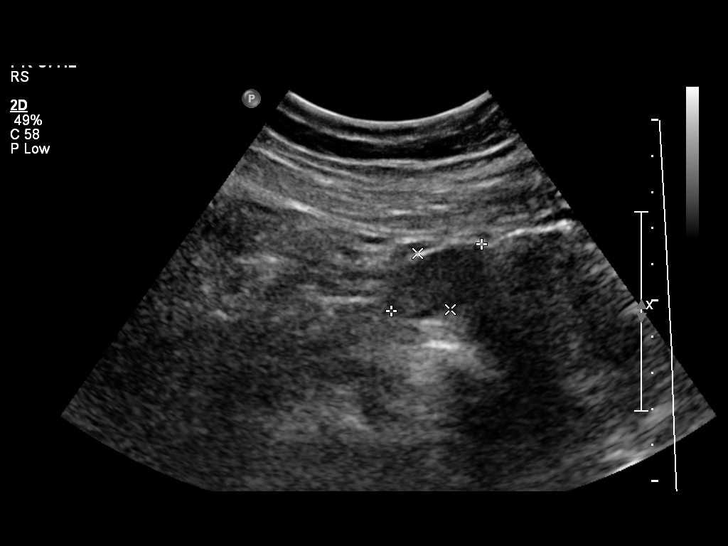
[im 32/32]
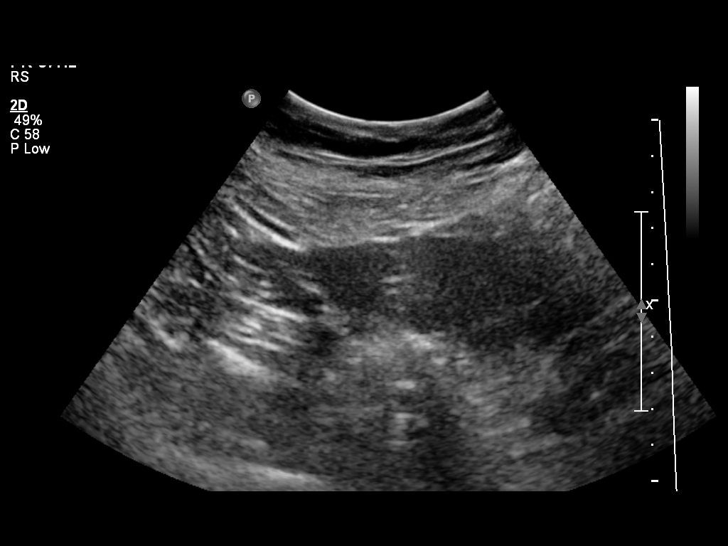

[14 of 28 positions shown; findings below may reference images not displayed]

Intrauterine gestational sac:  None
Yolk sac: None
Embryo: None

Maternal uterus/adnexae:
The ovaries have a normal appearance.  No adnexal mass identified.
Exam is technically difficult given the patient body habitus.  No
adnexal mass identified.  Correlation with quantitative beta HCG is
recommended.  Follow-up ultrasound is recommended depending on
quantitative beta HCG values.
IMPRESSION: 1.  No evidence for intrauterine or adnexal pregnancy.
Differential diagnosis includes early intrauterine pregnancy or
ectopic pregnancy.
2.  Serial quantitative beta HCGs and follow-up ultrasound
recommended.  See above.

## 2012-06-09 ENCOUNTER — Encounter (HOSPITAL_COMMUNITY): Payer: Self-pay | Admitting: Emergency Medicine

## 2012-06-09 ENCOUNTER — Emergency Department (HOSPITAL_COMMUNITY)
Admission: EM | Admit: 2012-06-09 | Discharge: 2012-06-09 | Disposition: A | Discharge status: Home / Self Care | Payer: BC Managed Care – PPO | Attending: Emergency Medicine | Admitting: Emergency Medicine

## 2012-06-09 DIAGNOSIS — M545 Low back pain, unspecified: Secondary | ICD-10-CM | POA: Insufficient documentation

## 2012-06-09 DIAGNOSIS — Z87891 Personal history of nicotine dependence: Secondary | ICD-10-CM | POA: Insufficient documentation

## 2012-06-09 MED ORDER — KETOROLAC TROMETHAMINE 10 MG PO TABS: 10.0000 mg | ORAL_TABLET | Freq: Four times a day (QID) | ORAL | Status: AC | PRN

## 2012-06-09 MED ORDER — MORPHINE SULFATE 4 MG/ML IJ SOLN
4.0000 mg | Freq: Once | INTRAMUSCULAR | Status: AC
  Administered 2012-06-09: 4 mg via INTRAMUSCULAR
  Filled 2012-06-09: qty 1

## 2012-06-09 MED ORDER — KETOROLAC TROMETHAMINE 60 MG/2ML IM SOLN
60.0000 mg | Freq: Once | INTRAMUSCULAR | Status: AC
  Administered 2012-06-09: 60 mg via INTRAMUSCULAR
  Filled 2012-06-09: qty 2

## 2012-06-09 MED ORDER — CYCLOBENZAPRINE HCL 10 MG PO TABS: 10.0000 mg | ORAL_TABLET | Freq: Two times a day (BID) | ORAL | Status: AC | PRN

## 2012-06-09 MED ORDER — CYCLOBENZAPRINE HCL 10 MG PO TABS
10.0000 mg | ORAL_TABLET | Freq: Once | ORAL | Status: AC
  Administered 2012-06-09: 10 mg via ORAL
  Filled 2012-06-09: qty 1

## 2012-06-09 NOTE — ED Notes (Signed)
PT. AMBULATED HALLWAY WITH NO ASSISTANCE , STILL WITH SLIGHT PAIN /SORENESS AT MID BACK.

## 2012-06-09 NOTE — ED Notes (Signed)
Pt reports threw back out while standing from a seated position and has history of the same states this occasion is worse.

## 2012-06-09 NOTE — ED Provider Notes (Signed)
I saw and evaluated the patient, reviewed the resident's note and I agree with the findings and plan.   26 year old female with atraumatic lower back pain. No evidence of spinal cord impingement. No use of blood thinning medication. No history of prior back surgery. No history of cancer. Denies IV drug use. On exam patient has 5 out of 5 strength bilateral lower extremities. Perineal sensation is intact. Patellar reflexes are one plus. Patient was treated symptomatically in the emergency room with improvement of symptoms. Plan continued symptomatic control. Return precautions were discussed. Outpatient followup  Raeford Razor, MD 06/09/12 [phone]

## 2012-06-09 NOTE — ED Provider Notes (Signed)
History     CSN: [id number]  Arrival date & time 06/09/12  2051   First MD Initiated Contact with Patient 06/09/12 2127      Chief Complaint  Patient presents with  . Back Pain    (Consider location/radiation/quality/duration/timing/severity/associated sxs/prior treatment) Patient is a 26 y.o. female presenting with back pain. The history is provided by the patient.  Back Pain  This is a recurrent problem. The current episode started less than 1 hour ago. The problem occurs constantly. The problem has not changed since onset.Associated with: standing off toilet. The pain is present in the lumbar spine. The quality of the pain is described as stabbing. Radiates to: right buttock. The pain is moderate. The symptoms are aggravated by bending and certain positions. Pertinent negatives include no chest pain, no fever, no numbness, no headaches and no abdominal pain. She has tried nothing for the symptoms. The treatment provided no relief. Risk factors include obesity, lack of exercise and a sedentary lifestyle.    Past Medical History  Diagnosis Date  . No pertinent past medical history     Past Surgical History  Procedure Date  . Cesarean section   . Dilation and curettage of uterus   . Wisdom tooth extraction     History reviewed. No pertinent family history.  History  Substance Use Topics  . Smoking status: Former Games developer  . Smokeless tobacco: Never Used  . Alcohol Use: No    OB History    Grav Para Term Preterm Abortions TAB SAB Ect Mult Living   5 2 2  2 2    2       Review of Systems  Constitutional: Negative for fever, chills, diaphoresis and fatigue.  HENT: Negative for ear pain, congestion, sore throat, facial swelling, mouth sores, trouble swallowing, neck pain and neck stiffness.   Eyes: Negative.   Respiratory: Negative for apnea, cough, chest tightness, shortness of breath and wheezing.   Cardiovascular: Negative for chest pain, palpitations and leg swelling.   Gastrointestinal: Negative for nausea, vomiting, abdominal pain, diarrhea and abdominal distention.  Genitourinary: Negative for hematuria, flank pain, vaginal discharge, difficulty urinating and menstrual problem.  Musculoskeletal: Positive for back pain. Negative for gait problem.  Skin: Negative for rash and wound.  Neurological: Negative for dizziness, tremors, seizures, syncope, facial asymmetry, numbness and headaches.  Psychiatric/Behavioral: Negative.   All other systems reviewed and are negative.    Allergies  Review of patient's allergies indicates no known allergies.  Home Medications  No current outpatient prescriptions on file.  BP 136/80  Pulse 94  Temp 98.9 F (37.2 C) (Oral)  Resp 18  SpO2 96%  LMP 06/17/2011  Physical Exam  Nursing note and vitals reviewed. Constitutional: She is oriented to person, place, and time. She appears well-developed and well-nourished. No distress.  HENT:  Head: Normocephalic and atraumatic.  Right Ear: External ear normal.  Left Ear: External ear normal.  Nose: Nose normal.  Mouth/Throat: Oropharynx is clear and moist. No oropharyngeal exudate.  Eyes: Conjunctivae and EOM are normal. Pupils are equal, round, and reactive to light. Right eye exhibits no discharge. Left eye exhibits no discharge.  Neck: Normal range of motion. Neck supple. No JVD present. No tracheal deviation present. No thyromegaly present.  Cardiovascular: Normal rate, regular rhythm, normal heart sounds and intact distal pulses.  Exam reveals no gallop and no friction rub.   No murmur heard. Pulmonary/Chest: Effort normal and breath sounds normal. No respiratory distress. She has no wheezes. She  has no rales. She exhibits no tenderness.  Abdominal: Soft. Bowel sounds are normal. She exhibits no distension. There is no tenderness. There is no rebound and no guarding.  Musculoskeletal: Normal range of motion.       Lumbar back: She exhibits tenderness, pain and  spasm. She exhibits normal range of motion, no bony tenderness, no swelling, no edema, no deformity and no laceration.       Patient with pain in the right lumbar region but no significant bony tenderness. Straight leg lift elcits pain in lower back  Lymphadenopathy:    She has no cervical adenopathy.  Neurological: She is alert and oriented to person, place, and time. No cranial nerve deficit. Coordination normal.  Skin: Skin is warm. No rash noted. She is not diaphoretic.  Psychiatric: She has a normal mood and affect. Her behavior is normal. Judgment and thought content normal.    ED Course  Procedures (including critical care time)  Labs Reviewed - No data to display No results found.   No diagnosis found.    MDM  26 year old female patient with recurring lower back pain presents with recurrence of low back pain. Patient not an IV drug user no history cancer fevers night sweats no bowel or bladder incontinence. Patient says she was at home said about toilet felt stabbing sensation in her lower right lumbar region. Patient says this is similar to previous back pain occurrences. Patient with no significant bony tenderness on exam pain with palpation right lower lumbar region with pain with straight leg lift. 5 out of 5 strength in bilateral legs normal DTRs downgoing Babinski no clonus normal tone. Will help treat acute low back pain with antispasmodics opioid pain medication anti-inflammatories.  Patient was improving pain here in the emergency department patient was discharged with prescription for ketorolac and cyclobenzaprine with instructions followup with PCP for rehabilitation for recurring low back pain  Case discussed with Dr. Sharlot Gowda, MD 06/09/12 2356

## 2012-06-13 NOTE — ED Provider Notes (Signed)
I saw and evaluated the patient, reviewed the resident's note and I agree with the findings and plan.  26yF with back pain. On exam obese. NAD. Mild paraspinal tenderness without overlying skin changes. Perineal sensation intact. LE strength 5/5. Patellar reflexes 1+. No evidence of cord impingement. Suspect body habitus may be contributing. Plan symptomatic tx. Return precautions discussed.  Raeford Razor, MD 06/13/12 [phone]

## 2012-07-22 ENCOUNTER — Encounter (HOSPITAL_COMMUNITY): Payer: Self-pay

## 2012-07-22 ENCOUNTER — Emergency Department (HOSPITAL_COMMUNITY)
Admission: EM | Admit: 2012-07-22 | Discharge: 2012-07-22 | Disposition: A | Discharge status: Home / Self Care | Payer: BC Managed Care – PPO | Source: Home / Self Care | Attending: Emergency Medicine | Admitting: Emergency Medicine

## 2012-07-22 DIAGNOSIS — L02214 Cutaneous abscess of groin: Secondary | ICD-10-CM

## 2012-07-22 DIAGNOSIS — L02219 Cutaneous abscess of trunk, unspecified: Secondary | ICD-10-CM

## 2012-07-22 DIAGNOSIS — L03319 Cellulitis of trunk, unspecified: Secondary | ICD-10-CM

## 2012-07-22 MED ORDER — HYDROCODONE-ACETAMINOPHEN 5-325 MG PO TABS: 2.0000 | ORAL_TABLET | ORAL | Status: AC | PRN

## 2012-07-22 MED ORDER — DOXYCYCLINE HYCLATE 100 MG PO CAPS: 100.0000 mg | ORAL_CAPSULE | Freq: Two times a day (BID) | ORAL | Status: DC

## 2012-07-22 NOTE — ED Provider Notes (Signed)
History     CSN: [id number]  Arrival date & time 07/22/12  1057   First MD Initiated Contact with Patient 07/22/12 1112      Chief Complaint  Patient presents with  . Cellulitis    (Consider location/radiation/quality/duration/timing/severity/associated sxs/prior treatment) Patient is a 26 y.o. female presenting with abscess. The history is provided by the patient. No language interpreter was used.  Abscess  This is a new problem. The current episode started less than one week ago. The problem occurs occasionally. The problem has been unchanged. The abscess is present on the groin. The problem is moderate. The abscess is characterized by itchiness. The abscess first occurred at home. There were no sick contacts.  Pt complains of pain in groin from an infection  Past Medical History  Diagnosis Date  . No pertinent past medical history     Past Surgical History  Procedure Date  . Cesarean section   . Dilation and curettage of uterus   . Wisdom tooth extraction     History reviewed. No pertinent family history.  History  Substance Use Topics  . Smoking status: Former Games developer  . Smokeless tobacco: Never Used  . Alcohol Use: No    OB History    Grav Para Term Preterm Abortions TAB SAB Ect Mult Living   5 2 2  2 2    2       Review of Systems  Skin: Positive for wound.  All other systems reviewed and are negative.    Allergies  Review of patient's allergies indicates no known allergies.  Home Medications  No current outpatient prescriptions on file.  BP 127/83  Pulse 91  Temp 98.2 F (36.8 C) (Oral)  Resp 18  SpO2 98%  LMP 07/02/2011  Breastfeeding? Unknown  Physical Exam  Nursing note and vitals reviewed. Constitutional: She is oriented to person, place, and time. She appears well-developed and well-nourished.  HENT:  Head: Normocephalic and atraumatic.  Cardiovascular: Normal rate and regular rhythm.   Pulmonary/Chest: Effort normal and breath  sounds normal.  Musculoskeletal: She exhibits tenderness.       Swollen left groin,  fluctuant  Neurological: She is alert and oriented to person, place, and time. She has normal reflexes.  Skin: There is erythema.  Psychiatric: She has a normal mood and affect.    ED Course  Procedures (including critical care time)  Labs Reviewed - No data to display No results found.   No diagnosis found.    MDM  Rxf or keflex and hydrocodone        Lonia Skinner [place], [place] 07/22/12 1222

## 2012-07-22 NOTE — ED Notes (Signed)
C/o 1 week duration of pain and swelling left groin area; has reportedly used warm compresses, and cannot wear underpants due t pain; pain extends from groin to perineal area; denies STD concerns today or any poss of pregnancy

## 2012-07-22 NOTE — ED Provider Notes (Signed)
Medical screening examination/treatment/procedure(s) were performed by non-physician practitioner and as supervising physician I was immediately available for consultation/collaboration.  Heddy Vidana   Maymuna Detzel, MD 07/22/12 1424 

## 2012-07-24 ENCOUNTER — Encounter (HOSPITAL_COMMUNITY): Payer: Self-pay | Admitting: *Deleted

## 2012-07-24 ENCOUNTER — Emergency Department (INDEPENDENT_AMBULATORY_CARE_PROVIDER_SITE_OTHER)
Admission: EM | Admit: 2012-07-24 | Discharge: 2012-07-24 | Disposition: A | Discharge status: Home / Self Care | Payer: BC Managed Care – PPO | Source: Home / Self Care | Attending: Emergency Medicine | Admitting: Emergency Medicine

## 2012-07-24 DIAGNOSIS — Z5189 Encounter for other specified aftercare: Secondary | ICD-10-CM

## 2012-07-24 MED ORDER — DOXYCYCLINE HYCLATE 100 MG PO CAPS: 100.0000 mg | ORAL_CAPSULE | Freq: Two times a day (BID) | ORAL | Status: AC

## 2012-07-24 NOTE — ED Notes (Signed)
Pt returns here for f/u of abscess in the perineal area from 2 days.  She states it is some better.and she has not gotten the RX filled

## 2012-07-24 NOTE — ED Provider Notes (Signed)
History     CSN: [id number]  Arrival date & time 07/24/12  1151   First MD Initiated Contact with Patient 07/24/12 1249      Chief Complaint  Patient presents with  . Abscess    (Consider location/radiation/quality/duration/timing/severity/associated sxs/prior treatment) HPI Comments: seen here on 8/26 for an abscess in her left groin.  This was I&D'd, and She was discharged home with  an unknown antibiotic and hydrocodone. She has not filled either of these. Per chart review, patient was sent home with doxycycline. Patient states that the pain and swelling has significantly improved, but reports continued foul-smelling purulent drainage. She has been doing local wound care with dial soap. States that the drain came out right before she came to the clinic. No nausea, vomiting, fevers. She is not a diabetic her current smoker. Culture results today show abundant gram negative coccobacilli, rare gram-positive cocci in pairs. Final results pending.  ROS as noted in HPI. All other ROS negative.   Patient is a 26 y.o. female presenting with wound check. The history is provided by the patient. No language interpreter was used.  Wound Check  She was treated in the ED 2 to 3 days ago. Previous treatment in the ED includes I&D of abscess. There has been no treatment since the wound repair. There has been colored discharge from the wound. The redness has improved. The swelling has improved. The pain has improved.    Past Medical History  Diagnosis Date  . No pertinent past medical history     Past Surgical History  Procedure Date  . Cesarean section   . Dilation and curettage of uterus   . Wisdom tooth extraction     Family History  Problem Relation Age of Onset  . Diabetes Mother   . Hypertension Father     History  Substance Use Topics  . Smoking status: Former Games developer  . Smokeless tobacco: Never Used  . Alcohol Use: No    OB History    Grav Para Term Preterm Abortions TAB  SAB Ect Mult Living   5 2 2  2 2    2       Review of Systems  Constitutional: Negative for fever.  Skin: Positive for wound.    Allergies  Review of patient's allergies indicates no known allergies.  Home Medications   Current Outpatient Rx  Name Route Sig Dispense Refill  . DOXYCYCLINE HYCLATE 100 MG PO CAPS Oral Take 1 capsule (100 mg total) by mouth 2 (two) times daily. X 10 days 20 capsule 0  . HYDROCODONE-ACETAMINOPHEN 5-325 MG PO TABS Oral Take 2 tablets by mouth every 4 (four) hours as needed for pain. 20 tablet 0    BP 119/69  Pulse 90  Temp 98.2 F (36.8 C) (Oral)  Resp 16  SpO2 100%  LMP 06/28/2012  Breastfeeding? No  Physical Exam  Nursing note and vitals reviewed. Constitutional: She is oriented to person, place, and time. She appears well-developed and well-nourished. No distress.  HENT:  Head: Normocephalic and atraumatic.  Eyes: Conjunctivae and EOM are normal.  Neck: Normal range of motion.  Cardiovascular: Normal rate.   Pulmonary/Chest: Effort normal.  Abdominal: She exhibits no distension.  Genitourinary:       See msk exam and drawing  Musculoskeletal: Normal range of motion.       Legs:      4 x 5 cm area of induration left groin with central surgical wound. Drain absent. No expressible purulent drainage.  Marked area of induration with permanent marker for reference.  Neurological: She is alert and oriented to person, place, and time. Coordination normal.  Skin: Skin is warm and dry.  Psychiatric: She has a normal mood and affect. Her behavior is normal. Judgment and thought content normal.    ED Course  Procedures (including critical care time)  Labs Reviewed - No data to display No results found.   1. Wound check, abscess     MDM  Previous records reviewed. As noted in history of present illness.  Gave pt printed rx for doxy and advised her to fill it. Discussed sx/sn that should prompt her return to dept. Referring to local primary  care practices for ongoing mgmnt. Pt agrees with plan.   Luiz Blare, MD 07/24/12 [phone]

## 2012-07-25 LAB — CULTURE, ROUTINE-ABSCESS

## 2012-07-26 NOTE — ED Notes (Signed)
Call from pharmacy to verify Rx

## 2012-07-30 ENCOUNTER — Telehealth (HOSPITAL_COMMUNITY): Payer: Self-pay | Admitting: *Deleted

## 2012-07-30 NOTE — ED Notes (Signed)
Abscess culture L side of pubis: Few streptococcus group F. Pt. treated with Doxycycline. Pt. did not fill Rx. on 8/26 and was told on recheck 8/28, to fill it.  Lab shown to Dr. Chaney Malling and she said tx. should be adequate. Vassie Moselle 07/30/2012

## 2012-07-30 NOTE — ED Notes (Signed)
I called pt., to check for clinical improvement.  I left a message to call. Pt. called back. Pt. verified x 2 and given results.  Pt. said she has improved.  It is getting smaller, no pain and just scant drainage when dressing changed. Pt. instructed to finish all of antibiotics and to return if any worsening of symptoms. Pt. voiced understanding.[name] 07/30/2012

## 2013-07-10 ENCOUNTER — Ambulatory Visit (INDEPENDENT_AMBULATORY_CARE_PROVIDER_SITE_OTHER): Payer: BC Managed Care – PPO | Admitting: Emergency Medicine

## 2013-07-10 VITALS — BP 136/82 | HR 88 | Temp 98.6°F | Resp 18 | Ht 65.0 in | Wt 276.0 lb

## 2013-07-10 DIAGNOSIS — N926 Irregular menstruation, unspecified: Secondary | ICD-10-CM

## 2013-07-10 DIAGNOSIS — N949 Unspecified condition associated with female genital organs and menstrual cycle: Secondary | ICD-10-CM

## 2013-07-10 DIAGNOSIS — N938 Other specified abnormal uterine and vaginal bleeding: Secondary | ICD-10-CM

## 2013-07-10 LAB — POCT URINE PREGNANCY: Preg Test, Ur: POSITIVE

## 2013-07-10 MED ORDER — PRENATAL 27-1 MG PO TABS: 1.0000 | ORAL_TABLET | Freq: Every day | ORAL | Status: DC

## 2013-07-10 NOTE — Patient Instructions (Addendum)

## 2013-07-10 NOTE — Progress Notes (Signed)
[facility] [address] [place] [address] [phone]  Date:  07/10/2013   Name:  [name]   DOB:  01-13-1986   MRN:  [id number]  PCP:  No PCP Per Patient    Chief Complaint: Pregnancy confirmation   History of Present Illness:  [name] is a 27 y.o. very pleasant female patient who presents with the following:  G2P2A0.  LMP 7/4 and has missed her most recent menses.  Has a positive HPT but needs a confirmatory test for her OB provider.  No medications.  Denies other complaint or health concern today.   There are no active problems to display for this patient.   Past Medical History  Diagnosis Date  . No pertinent past medical history     Past Surgical History  Procedure Laterality Date  . Cesarean section    . Dilation and curettage of uterus    . Wisdom tooth extraction      History  Substance Use Topics  . Smoking status: Former Games developer  . Smokeless tobacco: Never Used  . Alcohol Use: No    Family History  Problem Relation Age of Onset  . Diabetes Mother   . Hypertension Father     No Known Allergies  Medication list has been reviewed and updated.  No current outpatient prescriptions on file prior to visit.   No current facility-administered medications on file prior to visit.    Review of Systems:  As per HPI, otherwise negative.    Physical Examination: Filed Vitals:   07/10/13 1716  BP: 136/82  Pulse: 88  Temp: 98.6 F (37 C)  Resp: 18   Filed Vitals:   07/10/13 1716  Height: 5\' 5"  (1.651 m)  Weight: 276 lb (125.193 kg)   Body mass index is 45.93 kg/(m^2). Ideal Body Weight: Weight in (lb) to have BMI = 25: 149.9   GEN: WDWN, NAD, Non-toxic, Alert & Oriented x 3 HEENT: Atraumatic, Normocephalic.  Ears and Nose: No external deformity. EXTR: No clubbing/cyanosis/edema NEURO: Normal gait.  PSYCH: Normally interactive. Conversant. Not depressed or anxious appearing.  Calm demeanor.  ABD:   Soft, benign not tender  Assessment and Plan: Pregnancy Prenatal vitamins  Signed,  Phillips Odor, MD   Results for orders placed in visit on 07/10/13  POCT URINE PREGNANCY      Result Value Range   Preg Test, Ur Positive

## 2013-07-15 ENCOUNTER — Ambulatory Visit: Payer: BC Managed Care – PPO | Admitting: Advanced Practice Midwife

## 2013-08-07 ENCOUNTER — Encounter: Payer: Self-pay | Admitting: Advanced Practice Midwife

## 2013-08-07 ENCOUNTER — Ambulatory Visit (INDEPENDENT_AMBULATORY_CARE_PROVIDER_SITE_OTHER): Payer: BC Managed Care – PPO | Admitting: Advanced Practice Midwife

## 2013-08-07 VITALS — BP 124/75 | Temp 97.1°F | Wt 280.0 lb

## 2013-08-07 DIAGNOSIS — Z3480 Encounter for supervision of other normal pregnancy, unspecified trimester: Secondary | ICD-10-CM

## 2013-08-07 DIAGNOSIS — Z833 Family history of diabetes mellitus: Secondary | ICD-10-CM

## 2013-08-07 DIAGNOSIS — E669 Obesity, unspecified: Secondary | ICD-10-CM

## 2013-08-07 DIAGNOSIS — Z3481 Encounter for supervision of other normal pregnancy, first trimester: Secondary | ICD-10-CM

## 2013-08-07 DIAGNOSIS — Z3201 Encounter for pregnancy test, result positive: Secondary | ICD-10-CM

## 2013-08-07 LAB — OB RESULTS CONSOLE GC/CHLAMYDIA
CHLAMYDIA, DNA PROBE: NEGATIVE
Gonorrhea: NEGATIVE

## 2013-08-07 LAB — POCT URINALYSIS DIPSTICK
Bilirubin, UA: NEGATIVE
Blood, UA: NEGATIVE
Ketones, UA: NEGATIVE
Leukocytes, UA: NEGATIVE
pH, UA: 5

## 2013-08-07 NOTE — Progress Notes (Signed)
P- 90  Subjective:    [name] is being seen today for her first obstetrical visit.  This is a planned pregnancy. She is at [redacted]w[redacted]d gestation. Her obstetrical history is significant for obesity. Relationship with FOB: significant other, living together. Patient does intend to breast feed. Pregnancy history fully reviewed.  Menstrual History: OB History   Grav Para Term Preterm Abortions TAB SAB Ect Mult Living   6 2 2  3 2 1   2       Last Pap: 2013 Results normal. Menarche age: 87 Regular  Patient's last menstrual period was 05/31/2013.    The following portions of the patient's history were reviewed and updated as appropriate: allergies, current medications, past family history, past medical history, past social history, past surgical history and problem list.  Review of Systems A comprehensive review of systems was negative.    Objective:    No exam performed today, will perform NV. Had to leave room to attend delivery at hospital..  Physical Examination: General appearance - alert, well appearing, and in no distress Mental status - alert, oriented to person, place, and time Filed Vitals:   08/07/13 1430  BP: 124/75  Temp: 97.1 F (36.2 C)       Assessment:    Pregnancy at [redacted]w[redacted]d weeks  Patient Active Problem List   Diagnosis Date Noted  . Obesity 08/07/2013  . Family history of diabetes mellitus (DM) 08/07/2013  . Supervision of other normal pregnancy 08/07/2013      Plan:    Initial labs drawn. Prenatal vitamins.  Counseling provided regarding continued use of seat belts, cessation of alcohol consumption, smoking or use of illicit drugs; infection precautions i.e., influenza/TDAP immunizations, toxoplasmosis,CMV, parvovirus, listeria and varicella; workplace safety, exercise during pregnancy; routine dental care, safe medications, sexual activity, hot tubs, saunas, pools, travel, caffeine use, fish and methlymercury, potential toxins, hair treatments,  varicose veins Weight gain recommendations reviewed: underweight/BMI< 18.5--> gain 28 - 40 lbs; normal weight/BMI 18.5 - 24.9--> gain 25 - 35 lbs; overweight/BMI 25 - 29.9--> gain 15 - 25 lbs; obese/BMI >30->gain  11 - 20 lbs Problem list reviewed and updated. AFP3 discussed: Offer @ NV. Role of ultrasound in pregnancy discussed; fetal survey: Plan b/t 18-20 weeks. Amniocentesis discussed: not indicated. Follow up in 2 weeks. 90% of 30 min visit spent on counseling and coordination of care.   Amy Wilson Singer CNM

## 2013-08-08 ENCOUNTER — Encounter: Payer: Self-pay | Admitting: Advanced Practice Midwife

## 2013-08-08 LAB — OBSTETRIC PANEL
Basophils Absolute: 0 10*3/uL (ref 0.0–0.1)
Basophils Relative: 0 % (ref 0–1)
Hepatitis B Surface Ag: NEGATIVE
MCHC: 31.3 g/dL (ref 30.0–36.0)
Neutro Abs: 3.6 10*3/uL (ref 1.7–7.7)
Neutrophils Relative %: 54 % (ref 43–77)
RDW: 18.6 % — ABNORMAL HIGH (ref 11.5–15.5)

## 2013-08-08 LAB — PAP IG W/ RFLX HPV ASCU

## 2013-08-08 LAB — CULTURE, OB URINE: Colony Count: NO GROWTH

## 2013-08-08 LAB — GC/CHLAMYDIA PROBE AMP: GC Probe RNA: NEGATIVE

## 2013-08-11 LAB — HEMOGLOBINOPATHY EVALUATION
Hemoglobin Other: 0 %
Hgb A2 Quant: 2.3 % (ref 2.2–3.2)
Hgb F Quant: 0 % (ref 0.0–2.0)

## 2013-08-22 ENCOUNTER — Ambulatory Visit (INDEPENDENT_AMBULATORY_CARE_PROVIDER_SITE_OTHER): Payer: BC Managed Care – PPO | Admitting: Advanced Practice Midwife

## 2013-08-22 VITALS — BP 130/75 | Temp 98.4°F | Wt 281.0 lb

## 2013-08-22 DIAGNOSIS — Z3481 Encounter for supervision of other normal pregnancy, first trimester: Secondary | ICD-10-CM

## 2013-08-22 DIAGNOSIS — Z3480 Encounter for supervision of other normal pregnancy, unspecified trimester: Secondary | ICD-10-CM

## 2013-08-22 DIAGNOSIS — E559 Vitamin D deficiency, unspecified: Secondary | ICD-10-CM

## 2013-08-22 NOTE — Progress Notes (Signed)
p=95 

## 2013-08-22 NOTE — Progress Notes (Signed)
Subjective: [name] is a 27 y.o. at 11 weeks by LMP  Patient denies vaginal leaking of fluid or bleeding, denies contractions.  Reports negative fetal movment.  Denies concerns today.  Objective: Filed Vitals:   08/22/13 1505  BP: 130/75  Temp: 98.4 F (36.9 C)   Visualized heart rate on bedside US, poor quality, minimal fetal movement noted, unable to make measurement SP Fundal Height Fetal Position unknown.  Assessment: Patient Active Problem List   Diagnosis Date Noted  . Obesity 08/07/2013  . Family history of diabetes mellitus (DM) 08/07/2013  . Supervision of other normal pregnancy 08/07/2013    Plan: Patient to return to clinic in 4 weeks Scheduled for formal US for heartbeat and dating next week.  Reviewed warning signs in pregnancy. Patient to call with concerns PRN. Reviewed triage location. Encouraged patient to increase Vitamin D in vitamin.  Merik Mignano Wilson Singer CNM

## 2013-08-29 ENCOUNTER — Ambulatory Visit (HOSPITAL_COMMUNITY)
Admission: RE | Admit: 2013-08-29 | Discharge: 2013-08-29 | Disposition: A | Discharge status: Home / Self Care | Payer: BC Managed Care – PPO | Source: Ambulatory Visit | Attending: Advanced Practice Midwife | Admitting: Advanced Practice Midwife

## 2013-08-29 DIAGNOSIS — O368390 Maternal care for abnormalities of the fetal heart rate or rhythm, unspecified trimester, not applicable or unspecified: Secondary | ICD-10-CM | POA: Insufficient documentation

## 2013-08-29 DIAGNOSIS — Z3481 Encounter for supervision of other normal pregnancy, first trimester: Secondary | ICD-10-CM

## 2013-08-29 DIAGNOSIS — Z3689 Encounter for other specified antenatal screening: Secondary | ICD-10-CM | POA: Insufficient documentation

## 2013-09-07 ENCOUNTER — Telehealth: Payer: Self-pay | Admitting: *Deleted

## 2013-09-07 MED ORDER — VITAFOL-ONE 29-1-200 MG PO CAPS: 1.0000 | ORAL_CAPSULE | Freq: Every day | ORAL | Status: DC

## 2013-09-07 NOTE — Telephone Encounter (Signed)
Call placed to patient no answer. A detailed  Voice message was left informing patient per Amy instruction. A rx for vitafol sent to pharmacy. A voice message was left advising patient to call office if any questions.

## 2013-09-07 NOTE — Telephone Encounter (Signed)
Message copied by Glendell Docker on Sun Sep 07, 2013 12:35 PM ------      Message from: Cal[place]oun, [place] H      Created: Tue Aug 12, 2013 11:03 AM       Please notify patient of low Vitamin D. We can supply for her a PNV with at least 1000 IU of Vitamin D.            Thank you, Amy Wilson Singer CNM      ----- Message -----         From: [facility] Interface         Sent: 08/08/2013   6:14 AM           To: Rickard Patience, CNM                   ------

## 2013-09-19 ENCOUNTER — Ambulatory Visit (INDEPENDENT_AMBULATORY_CARE_PROVIDER_SITE_OTHER): Payer: BC Managed Care – PPO | Admitting: Advanced Practice Midwife

## 2013-09-19 ENCOUNTER — Encounter: Payer: Self-pay | Admitting: Advanced Practice Midwife

## 2013-09-19 VITALS — BP 121/78 | Temp 97.2°F | Wt 283.0 lb

## 2013-09-19 DIAGNOSIS — Z3480 Encounter for supervision of other normal pregnancy, unspecified trimester: Secondary | ICD-10-CM

## 2013-09-19 DIAGNOSIS — Z3482 Encounter for supervision of other normal pregnancy, second trimester: Secondary | ICD-10-CM

## 2013-09-19 LAB — POCT URINALYSIS DIPSTICK
Bilirubin, UA: NEGATIVE
Blood, UA: NEGATIVE
Glucose, UA: NEGATIVE
Ketones, UA: NEGATIVE
Leukocytes, UA: NEGATIVE
Nitrite, UA: NEGATIVE
Spec Grav, UA: 1.02
Urobilinogen, UA: NEGATIVE
pH, UA: 6

## 2013-09-19 NOTE — Progress Notes (Signed)
Doing well today. No concerns. Quad screen today. ROB in 4 weeks 20 wk [place] at that time. Chloe Flis Wilson Singer CNM

## 2013-09-23 LAB — AFP, QUAD SCREEN
HCG, Total: 32814 m[IU]/mL
INH: 229.2 pg/mL
Interpretation-AFP: POSITIVE — AB
MoM for AFP: 0.56
MoM for hCG: 1.82
Open Spina bifida: NEGATIVE
Osb Risk: <1:54600 {titer}
Tri 18 Scr Risk Est: NEGATIVE
uE3 Mom: 1.01
uE3 Value: 0.4 ng/mL

## 2013-09-30 ENCOUNTER — Other Ambulatory Visit: Payer: Self-pay | Admitting: Advanced Practice Midwife

## 2013-09-30 ENCOUNTER — Encounter (HOSPITAL_COMMUNITY): Payer: Self-pay | Admitting: Advanced Practice Midwife

## 2013-09-30 DIAGNOSIS — O280 Abnormal hematological finding on antenatal screening of mother: Secondary | ICD-10-CM

## 2013-09-30 DIAGNOSIS — Z3689 Encounter for other specified antenatal screening: Secondary | ICD-10-CM

## 2013-10-10 ENCOUNTER — Ambulatory Visit (HOSPITAL_COMMUNITY)
Admission: RE | Admit: 2013-10-10 | Discharge: 2013-10-10 | Disposition: A | Discharge status: Home / Self Care | Payer: BC Managed Care – PPO | Source: Ambulatory Visit | Attending: Advanced Practice Midwife | Admitting: Advanced Practice Midwife

## 2013-10-10 DIAGNOSIS — O289 Unspecified abnormal findings on antenatal screening of mother: Secondary | ICD-10-CM | POA: Insufficient documentation

## 2013-10-10 DIAGNOSIS — Z3689 Encounter for other specified antenatal screening: Secondary | ICD-10-CM

## 2013-10-10 DIAGNOSIS — O280 Abnormal hematological finding on antenatal screening of mother: Secondary | ICD-10-CM

## 2013-10-10 DIAGNOSIS — Z1389 Encounter for screening for other disorder: Secondary | ICD-10-CM | POA: Insufficient documentation

## 2013-10-10 DIAGNOSIS — O34219 Maternal care for unspecified type scar from previous cesarean delivery: Secondary | ICD-10-CM | POA: Insufficient documentation

## 2013-10-10 DIAGNOSIS — E669 Obesity, unspecified: Secondary | ICD-10-CM | POA: Insufficient documentation

## 2013-10-10 DIAGNOSIS — Z363 Encounter for antenatal screening for malformations: Secondary | ICD-10-CM | POA: Insufficient documentation

## 2013-10-10 DIAGNOSIS — O358XX0 Maternal care for other (suspected) fetal abnormality and damage, not applicable or unspecified: Secondary | ICD-10-CM | POA: Insufficient documentation

## 2013-10-10 NOTE — Progress Notes (Signed)
Genetic Counseling  High-Risk Gestation Note  Appointment Date:  10/10/2013 Referred By: Rickard Patience, CNM Date of Birth:  19-Aug-1986 Partner:  [name]    Pregnancy History: [id number] Estimated Date of Delivery: 03/07/14 Estimated Gestational Age: [redacted]w[redacted]d Attending: Particia Nearing, MD   [name] and her partner, Mr. [name], were seen for genetic counseling because of an increased risk for fetal Down syndrome based on Quad screen performed through [facility].  They were counseled regarding the Quad screen result and the associated 1 in 159 risk for fetal Down syndrome.  We reviewed chromosomes, nondisjunction, and the common features and variable prognosis of Down syndrome.  In addition, we reviewed the screen adjusted reduction in risks for trisomy 18 (1 in 29,300) and ONTDs (< 1 in 54,600).  We also discussed other explanations for a screen positive result including: a gestational dating error, differences in maternal metabolism, and normal variation. They understand that this screening is not diagnostic for Down syndrome but provides a risk assessment.  We reviewed available screening options including noninvasive prenatal screening (NIPS)/cell free fetal DNA (cffDNA) testing and detailed ultrasound.  They were counseled that screening tests are used to modify a patient's a priori risk for aneuploidy, typically based on age. This estimate provides a pregnancy specific risk assessment. We reviewed the benefits and limitations of each option. Specifically, we discussed the conditions for which each test screens, the detection rates, and false positive rates of each. A complete ultrasound was performed today. The ultrasound report will be sent under separate cover. We reviewed with the patient that there were no visualized fetal anomalies or markers suggestive of aneuploidy. They were also counseled regarding diagnostic testing via amniocentesis. We reviewed the  approximate 1 in 300-500 risk for complications for amniocentesis, including spontaneous pregnancy loss.   After consideration of all the options, she declined NIPS and amniocentesis. They stated that they were comfortable with the risk assessment provided by Quad screen and targeted ultrasound.  They understand that screening tests cannot rule out all birth defects or genetic syndromes. The patient was advised of this limitation and states she still does not want additional testing at this time.   [name] was provided with written information regarding sickle cell anemia (SCA) including the carrier frequency and incidence in the African-American population, the availability of carrier testing and prenatal diagnosis if indicated.  In addition, we discussed that hemoglobinopathies are routinely screened for as part of the [place] newborn screening panel.  [name] previously had hemoglobin electrophoresis performed through her OB office, which indicated that presence of normal adult hemoglobin.   Both family histories were reviewed and found to be noncontributory for birth defects, intellectual disability, recurrent pregnancy loss, or known genetic conditions. Consanguinity was denied. Without further information regarding the provided family history, an accurate genetic risk cannot be calculated. Further genetic counseling is warranted if more information is obtained.  [name] denied exposure to environmental toxins or chemical agents. She denied the use of alcohol, tobacco or street drugs. She denied significant viral illnesses during the course of her pregnancy. Her medical and surgical histories were contributory for two previous TABs.   I counseled this couple for approximately 40 minutes regarding the above risks and available options.   Quinn Plowman, MS,  Certified Genetic Counselor  10/10/2013

## 2013-10-14 ENCOUNTER — Ambulatory Visit (INDEPENDENT_AMBULATORY_CARE_PROVIDER_SITE_OTHER): Payer: BC Managed Care – PPO | Admitting: Advanced Practice Midwife

## 2013-10-14 ENCOUNTER — Encounter: Payer: Self-pay | Admitting: Advanced Practice Midwife

## 2013-10-14 ENCOUNTER — Other Ambulatory Visit: Payer: BC Managed Care – PPO

## 2013-10-14 VITALS — BP 122/80 | Wt 285.0 lb

## 2013-10-14 DIAGNOSIS — O280 Abnormal hematological finding on antenatal screening of mother: Secondary | ICD-10-CM | POA: Insufficient documentation

## 2013-10-14 DIAGNOSIS — Z3480 Encounter for supervision of other normal pregnancy, unspecified trimester: Secondary | ICD-10-CM

## 2013-10-14 DIAGNOSIS — Z3482 Encounter for supervision of other normal pregnancy, second trimester: Secondary | ICD-10-CM

## 2013-10-14 DIAGNOSIS — O289 Unspecified abnormal findings on antenatal screening of mother: Secondary | ICD-10-CM

## 2013-10-14 LAB — POCT URINALYSIS DIPSTICK
Ketones, UA: NEGATIVE
Spec Grav, UA: >=1.03
Urobilinogen, UA: NEGATIVE

## 2013-10-14 NOTE — Progress Notes (Signed)
Subjective: [name] is a 27 y.o. at 19 weeks by LMP  Patient denies vaginal leaking of fluid or bleeding, denies contractions.  Reports positive fetal movment.  Denies concerns today. Patient was very worried about abnormal quad screen. Happy once she had normal [place] reports. Expecting a girl Serenity.  Objective: Filed Vitals:   10/14/13 1628  BP: 122/80   140 FHR @U  Fundal Height Fetal Position NA  Assessment: Patient Active Problem List   Diagnosis Date Noted  . Vitamin D deficiency 08/22/2013  . Obesity 08/07/2013  . Family history of diabetes mellitus (DM) 08/07/2013  . Supervision of other normal pregnancy 08/07/2013    Plan: Patient to return to clinic in 19 weeks Plan GCT NV Reviewed warning signs in pregnancy. Patient to call with concerns PRN. Reviewed triage location.  Khushi Zupko Wilson Singer CNM

## 2013-10-14 NOTE — Progress Notes (Signed)
HR - 96 Pt in office for routine OB visit

## 2013-11-14 ENCOUNTER — Ambulatory Visit (INDEPENDENT_AMBULATORY_CARE_PROVIDER_SITE_OTHER): Payer: BC Managed Care – PPO | Admitting: Advanced Practice Midwife

## 2013-11-14 VITALS — BP 120/77 | Temp 98.0°F | Wt 292.0 lb

## 2013-11-14 DIAGNOSIS — Z3482 Encounter for supervision of other normal pregnancy, second trimester: Secondary | ICD-10-CM

## 2013-11-14 DIAGNOSIS — Z3480 Encounter for supervision of other normal pregnancy, unspecified trimester: Secondary | ICD-10-CM

## 2013-11-14 NOTE — Progress Notes (Signed)
Subjective: [name] is a 27 y.o. at 23 weeks by LMP nies contractions.  Reports positive fetal movment. Patient denies vaginal leaking of fluid or bleeding, de  Denies concerns today.  Objective: Filed Vitals:   11/14/13 1533  BP: 120/77  Temp: 98 F (36.7 C)   140 FHR @ U Fundal Height Fetal Position NA  Assessment: Patient Active Problem List   Diagnosis Date Noted  . Abnormal quad screen 10/14/2013  . Vitamin D deficiency 08/22/2013  . Obesity 08/07/2013  . Family history of diabetes mellitus (DM) 08/07/2013  . Supervision of other normal pregnancy 08/07/2013    Plan: Patient to return to clinic in 3 weeks Glucose test NV Changed patient to OB petite complete, increasing Vitamin D Reviewed warning signs in pregnancy. Patient to call with concerns PRN. Reviewed triage location.  20 min spent with patient greater than 80% spent in counseling and coordination of care.   Anastasia Tompson Wilson Singer CNM

## 2013-11-14 NOTE — Progress Notes (Signed)
HR -97 Pt in office today for routine OB visit, denies concerns at this time 

## 2013-12-05 ENCOUNTER — Other Ambulatory Visit: Payer: Medicaid Other

## 2013-12-05 ENCOUNTER — Ambulatory Visit (INDEPENDENT_AMBULATORY_CARE_PROVIDER_SITE_OTHER): Payer: BC Managed Care – PPO | Admitting: Advanced Practice Midwife

## 2013-12-05 VITALS — BP 112/76 | Temp 97.9°F | Wt 289.0 lb

## 2013-12-05 DIAGNOSIS — Z98891 History of uterine scar from previous surgery: Secondary | ICD-10-CM

## 2013-12-05 DIAGNOSIS — Z8759 Personal history of other complications of pregnancy, childbirth and the puerperium: Secondary | ICD-10-CM

## 2013-12-05 DIAGNOSIS — Z3480 Encounter for supervision of other normal pregnancy, unspecified trimester: Secondary | ICD-10-CM

## 2013-12-05 DIAGNOSIS — Z3482 Encounter for supervision of other normal pregnancy, second trimester: Secondary | ICD-10-CM

## 2013-12-05 DIAGNOSIS — Z8742 Personal history of other diseases of the female genital tract: Secondary | ICD-10-CM

## 2013-12-05 LAB — CBC
HCT: 33 % — ABNORMAL LOW (ref 36.0–46.0)
HEMOGLOBIN: 10.5 g/dL — AB (ref 12.0–15.0)
MCH: 23.8 pg — AB (ref 26.0–34.0)
MCHC: 31.8 g/dL (ref 30.0–36.0)
MCV: 74.8 fL — AB (ref 78.0–100.0)
PLATELETS: 336 10*3/uL (ref 150–400)
RBC: 4.41 MIL/uL (ref 3.87–5.11)
RDW: 17.8 % — ABNORMAL HIGH (ref 11.5–15.5)
WBC: 8.1 10*3/uL (ref 4.0–10.5)

## 2013-12-05 LAB — POCT URINALYSIS DIPSTICK
Bilirubin, UA: NEGATIVE
Blood, UA: NEGATIVE
Glucose, UA: NEGATIVE
Ketones, UA: NEGATIVE
Leukocytes, UA: NEGATIVE
Nitrite, UA: NEGATIVE
SPEC GRAV UA: 1.02
UROBILINOGEN UA: NEGATIVE
pH, UA: 5

## 2013-12-05 NOTE — Progress Notes (Signed)
Subjective: [name] is a 28 y.o. at 26 weeks by LMP  Patient denies vaginal leaking of fluid or bleeding, denies contractions.  Reports positive fetal movment.  Denies concerns today. Desires a Tubal Ligation, certain she does not want more children. Reports initial c-section was emergent due to her babies heart rate and high blood pressure.  Objective: Filed Vitals:   12/05/13 0853  BP: 112/76  Temp: 97.9 F (36.6 C)   140 FHR 28 Fundal Height Fetal Position cephalic  Assessment: Patient Active Problem List   Diagnosis Date Noted  . H/O: C-section 12/05/2013  . History of pre-eclampsia 12/05/2013  . Desires Tubal Ligation w/ Repeat C-section 12/05/2013  . Abnormal quad screen 10/14/2013  . Vitamin D deficiency 08/22/2013  . Obesity 08/07/2013  . Family history of diabetes mellitus (DM) 08/07/2013  . Supervision of other normal pregnancy 08/07/2013    Plan: Patient to return to clinic in 2-3 weeks GCT and labs today Tubal Ligation counseling and Med-178 signed today. Reviewed with patient that this is not a reversible procedure, reviewed risk and benefits. Patient aware she may change her mind prior to the procedure. Reviewed warning signs in pregnancy. Patient to call with concerns PRN. Reviewed triage location.  20 min spent with patient greater than 80% spent in counseling and coordination of care.   [name] CNM

## 2013-12-05 NOTE — Progress Notes (Signed)
HR - 89 Pt in for routine OB visit and  Two hour blood sugar, Pt would like to discuss who will do c-section and when it will be scheduled

## 2013-12-06 LAB — RPR

## 2013-12-06 LAB — GLUCOSE TOLERANCE, 2 HOURS W/ 1HR
GLUCOSE, FASTING: 68 mg/dL — AB (ref 70–99)
Glucose, 1 hour: 131 mg/dL (ref 70–170)
Glucose, 2 hour: 94 mg/dL (ref 70–139)

## 2013-12-06 LAB — HIV ANTIBODY (ROUTINE TESTING W REFLEX): HIV: NONREACTIVE

## 2013-12-18 ENCOUNTER — Ambulatory Visit (INDEPENDENT_AMBULATORY_CARE_PROVIDER_SITE_OTHER): Payer: BC Managed Care – PPO | Admitting: Obstetrics

## 2013-12-18 ENCOUNTER — Encounter: Payer: Self-pay | Admitting: Obstetrics

## 2013-12-18 VITALS — BP 128/80 | Temp 98.4°F | Wt 293.0 lb

## 2013-12-18 DIAGNOSIS — Z3480 Encounter for supervision of other normal pregnancy, unspecified trimester: Secondary | ICD-10-CM

## 2013-12-18 LAB — POCT URINALYSIS DIPSTICK
Bilirubin, UA: NEGATIVE
Blood, UA: NEGATIVE
GLUCOSE UA: NEGATIVE
KETONES UA: NEGATIVE
Leukocytes, UA: NEGATIVE
Nitrite, UA: NEGATIVE
SPEC GRAV UA: 1.025
Urobilinogen, UA: NEGATIVE
pH, UA: 5

## 2013-12-18 NOTE — Progress Notes (Signed)
HR - 102 Pt in office today for routine OB visit, denies concerns at this time

## 2014-01-01 ENCOUNTER — Ambulatory Visit (INDEPENDENT_AMBULATORY_CARE_PROVIDER_SITE_OTHER): Payer: BC Managed Care – PPO | Admitting: Obstetrics

## 2014-01-01 VITALS — Temp 97.9°F | Wt 293.0 lb

## 2014-01-01 DIAGNOSIS — Z3480 Encounter for supervision of other normal pregnancy, unspecified trimester: Secondary | ICD-10-CM

## 2014-01-01 LAB — POCT URINALYSIS DIPSTICK
BILIRUBIN UA: NEGATIVE
Blood, UA: NEGATIVE
Glucose, UA: NEGATIVE
KETONES UA: NEGATIVE
LEUKOCYTES UA: NEGATIVE
Nitrite, UA: NEGATIVE
Spec Grav, UA: 1.02
Urobilinogen, UA: NEGATIVE
pH, UA: 6

## 2014-01-01 NOTE — Progress Notes (Signed)
Pulse: 101 Patient denies any concerns.

## 2014-01-02 ENCOUNTER — Encounter: Payer: Self-pay | Admitting: Obstetrics

## 2014-01-07 ENCOUNTER — Other Ambulatory Visit: Payer: Self-pay | Admitting: *Deleted

## 2014-01-07 ENCOUNTER — Encounter: Payer: Self-pay | Admitting: Obstetrics

## 2014-01-15 ENCOUNTER — Ambulatory Visit (INDEPENDENT_AMBULATORY_CARE_PROVIDER_SITE_OTHER): Payer: BC Managed Care – PPO | Admitting: Obstetrics

## 2014-01-15 VITALS — BP 131/79 | Temp 98.6°F | Wt 293.0 lb

## 2014-01-15 DIAGNOSIS — Z3480 Encounter for supervision of other normal pregnancy, unspecified trimester: Secondary | ICD-10-CM

## 2014-01-15 LAB — POCT URINALYSIS DIPSTICK
BILIRUBIN UA: NEGATIVE
Blood, UA: NEGATIVE
GLUCOSE UA: NEGATIVE
Ketones, UA: NEGATIVE
LEUKOCYTES UA: NEGATIVE
NITRITE UA: NEGATIVE
PH UA: 6
Protein, UA: NEGATIVE
Spec Grav, UA: 1.01
Urobilinogen, UA: NEGATIVE

## 2014-01-15 NOTE — Progress Notes (Signed)
Pulse:97 Patient states she is having pelvic pressure. Patient denies any concerns.

## 2014-01-16 ENCOUNTER — Encounter: Payer: Self-pay | Admitting: Obstetrics

## 2014-01-29 ENCOUNTER — Encounter: Payer: Self-pay | Admitting: Obstetrics

## 2014-01-29 ENCOUNTER — Ambulatory Visit (INDEPENDENT_AMBULATORY_CARE_PROVIDER_SITE_OTHER): Payer: BC Managed Care – PPO | Admitting: Obstetrics

## 2014-01-29 ENCOUNTER — Inpatient Hospital Stay (HOSPITAL_COMMUNITY)
Admission: AD | Admit: 2014-01-29 | Discharge: 2014-01-29 | Disposition: A | Discharge status: Home / Self Care | Payer: BC Managed Care – PPO | Source: Ambulatory Visit | Attending: Obstetrics | Admitting: Obstetrics

## 2014-01-29 ENCOUNTER — Encounter: Payer: Self-pay | Admitting: *Deleted

## 2014-01-29 ENCOUNTER — Encounter (HOSPITAL_COMMUNITY): Payer: Self-pay | Admitting: *Deleted

## 2014-01-29 VITALS — BP 121/78 | Temp 98.1°F | Wt 293.0 lb

## 2014-01-29 DIAGNOSIS — M545 Low back pain, unspecified: Secondary | ICD-10-CM | POA: Insufficient documentation

## 2014-01-29 DIAGNOSIS — Z87891 Personal history of nicotine dependence: Secondary | ICD-10-CM | POA: Insufficient documentation

## 2014-01-29 DIAGNOSIS — Z3480 Encounter for supervision of other normal pregnancy, unspecified trimester: Secondary | ICD-10-CM

## 2014-01-29 DIAGNOSIS — O9989 Other specified diseases and conditions complicating pregnancy, childbirth and the puerperium: Secondary | ICD-10-CM

## 2014-01-29 DIAGNOSIS — O26893 Other specified pregnancy related conditions, third trimester: Secondary | ICD-10-CM

## 2014-01-29 DIAGNOSIS — M549 Dorsalgia, unspecified: Secondary | ICD-10-CM

## 2014-01-29 DIAGNOSIS — R52 Pain, unspecified: Secondary | ICD-10-CM

## 2014-01-29 DIAGNOSIS — O99891 Other specified diseases and conditions complicating pregnancy: Secondary | ICD-10-CM | POA: Insufficient documentation

## 2014-01-29 DIAGNOSIS — N949 Unspecified condition associated with female genital organs and menstrual cycle: Secondary | ICD-10-CM | POA: Insufficient documentation

## 2014-01-29 LAB — URINE MICROSCOPIC-ADD ON

## 2014-01-29 LAB — POCT URINALYSIS DIPSTICK
Bilirubin, UA: NEGATIVE
Blood, UA: NEGATIVE
GLUCOSE UA: NEGATIVE
Ketones, UA: NEGATIVE
Leukocytes, UA: NEGATIVE
NITRITE UA: NEGATIVE
SPEC GRAV UA: 1.02
UROBILINOGEN UA: NEGATIVE
pH, UA: 6

## 2014-01-29 LAB — URINALYSIS, ROUTINE W REFLEX MICROSCOPIC
BILIRUBIN URINE: NEGATIVE
Glucose, UA: NEGATIVE mg/dL
Ketones, ur: NEGATIVE mg/dL
Leukocytes, UA: NEGATIVE
NITRITE: NEGATIVE
PROTEIN: NEGATIVE mg/dL
SPECIFIC GRAVITY, URINE: 1.025 (ref 1.005–1.030)
UROBILINOGEN UA: 0.2 mg/dL (ref 0.0–1.0)
pH: 6 (ref 5.0–8.0)

## 2014-01-29 MED ORDER — OXYCODONE HCL 10 MG PO TABS: 10.0000 mg | ORAL_TABLET | Freq: Four times a day (QID) | ORAL | Status: DC | PRN

## 2014-01-29 MED ORDER — CYCLOBENZAPRINE HCL 10 MG PO TABS
10.0000 mg | ORAL_TABLET | Freq: Once | ORAL | Status: AC
  Administered 2014-01-29: 10 mg via ORAL
  Filled 2014-01-29: qty 1

## 2014-01-29 NOTE — Progress Notes (Signed)
Pulse:97 Patient states she is having pelvic pain and pressure. Patient states she was seen at the ER this morning for the pain and they told her the doctor may be able to prescribe a maternity belt.

## 2014-01-29 NOTE — MAU Provider Note (Signed)
History     CSN: [id number]  Arrival date and time: 01/29/14 4098   First Provider Initiated Contact with Patient 01/29/14 [phone]      Chief Complaint  Patient presents with  . Pelvic Pain   HPI [name] is a 28 y.o. [id number] at [redacted]w[redacted]d who presents to MAU today with complaint of low back and pelvic pain. The back pain started on Sunday and the pelvic pain started yesterday. The patient states pain is as bad as 10/10 depending on position. She states that the pain is worse with ambulation and change or positions. She has had mild nausea without vomiting, diarrhea or constipation. She denies vaginal bleeding, discharge, LOF, contractions, fever or UTI symptoms. She reports good fetal movement.   OB History   Grav Para Term Preterm Abortions TAB SAB Ect Mult Living   6 2 2  3 2 1   2       Past Medical History  Diagnosis Date  . No pertinent past medical history   . Medical history non-contributory     Past Surgical History  Procedure Laterality Date  . Cesarean section    . Dilation and curettage of uterus    . Wisdom tooth extraction      Family History  Problem Relation Age of Onset  . Diabetes Mother   . Hypertension Father     History  Substance Use Topics  . Smoking status: Former Games developer  . Smokeless tobacco: Never Used  . Alcohol Use: No    Allergies: No Known Allergies  Prescriptions prior to admission  Medication Sig Dispense Refill  . acetaminophen (TYLENOL) 500 MG tablet Take 1,000 mg by mouth daily as needed for moderate pain.      . Prenatal Vit-FePoly-FA-DHA (VITAFOL-ONE) 29-1-200 MG CAPS Take 1 capsule by mouth daily.  30 capsule  12    Review of Systems  Constitutional: Negative for fever and malaise/fatigue.  Gastrointestinal: Positive for nausea and abdominal pain. Negative for vomiting, diarrhea and constipation.  Genitourinary: Negative for dysuria, urgency and frequency.       Neg - vaginal bleeding, discharge, LOF   Musculoskeletal: Positive for back pain.   Physical Exam   Blood pressure 122/77, pulse 113, temperature 97.9 F (36.6 C), temperature source Oral, resp. rate 18, height 5' 5.5" (1.664 m), weight 292 lb 3.2 oz (132.541 kg), last menstrual period 05/31/2013.  Physical Exam  Constitutional: She is oriented to person, place, and time. She appears well-developed and well-nourished. No distress.  HENT:  Head: Normocephalic and atraumatic.  Cardiovascular: Normal rate, regular rhythm and normal heart sounds.   Respiratory: Effort normal and breath sounds normal. No respiratory distress.  GI: Soft. Bowel sounds are normal. She exhibits no distension and no mass. There is no tenderness. There is no rebound and no guarding.  Neurological: She is alert and oriented to person, place, and time.  Skin: Skin is warm and dry. No erythema.  Psychiatric: She has a normal mood and affect.  Dilation: Closed Effacement (%): 50 Cervical Position: Posterior Station: -3 Presentation: Vertex Exam by:: J.Ether,PA  Results for orders placed during the hospital encounter of 01/29/14 (from the past 24 hour(s))  URINALYSIS, ROUTINE W REFLEX MICROSCOPIC     Status: Abnormal   Collection Time    01/29/14  7:45 AM      Result Value Ref Range   Color, Urine YELLOW  YELLOW   APPearance CLEAR  CLEAR   Specific Gravity, Urine 1.025  1.005 -  1.030   pH 6.0  5.0 - 8.0   Glucose, UA NEGATIVE  NEGATIVE mg/dL   Hgb urine dipstick TRACE (*) NEGATIVE   Bilirubin Urine NEGATIVE  NEGATIVE   Ketones, ur NEGATIVE  NEGATIVE mg/dL   Protein, ur NEGATIVE  NEGATIVE mg/dL   Urobilinogen, UA 0.2  0.0 - 1.0 mg/dL   Nitrite NEGATIVE  NEGATIVE   Leukocytes, UA NEGATIVE  NEGATIVE  URINE MICROSCOPIC-ADD ON     Status: Abnormal   Collection Time    01/29/14  7:45 AM      Result Value Ref Range   Squamous Epithelial / LPF MANY (*) RARE   WBC, UA 0-2  <3 WBC/hpf   RBC / HPF 3-6  <3 RBC/hpf   Bacteria, UA FEW (*) RARE   Fetal  Monitoring: Baseline: 130 bpm, moderate variability, + accelerations, no decelerations Contractions: none MAU Course  Procedures None  MDM Cervix is closed. No contractions noted.  Flexeril given in MAU Assessment and Plan  A: SIUP at 7072w5d Back pain in pregnancy, third trimester Pelvic pain in pregnancy, third trimester  P:  Discharge home Patient advised to use maternity belt, warm bath/shower, Tylenol PRN for pain Discussed moderation of activity Patient advised to follow-up with [facility] as scheduled for routine prenatal care Patient may return to MAU as needed or if her condition were to change or worsen  Freddi StarrJulie N Ethier, PA-C  01/29/2014, 8:25 AM

## 2014-01-29 NOTE — MAU Note (Signed)
Pt reports she started having pelvic pain and pressure and back pain last night. Got worse this morning. Having difficulty walking and painful to sit.

## 2014-01-29 NOTE — Discharge Instructions (Signed)

## 2014-02-03 ENCOUNTER — Encounter: Payer: Self-pay | Admitting: Obstetrics

## 2014-02-05 ENCOUNTER — Encounter: Payer: Self-pay | Admitting: Obstetrics

## 2014-02-05 ENCOUNTER — Encounter: Payer: BC Managed Care – PPO | Admitting: Obstetrics

## 2014-02-05 ENCOUNTER — Encounter: Payer: Self-pay | Admitting: *Deleted

## 2014-02-05 ENCOUNTER — Ambulatory Visit (INDEPENDENT_AMBULATORY_CARE_PROVIDER_SITE_OTHER): Payer: BC Managed Care – PPO | Admitting: Obstetrics

## 2014-02-05 VITALS — BP 125/79 | Temp 97.8°F | Wt 301.0 lb

## 2014-02-05 DIAGNOSIS — Z3480 Encounter for supervision of other normal pregnancy, unspecified trimester: Secondary | ICD-10-CM

## 2014-02-05 LAB — POCT URINALYSIS DIPSTICK
Bilirubin, UA: NEGATIVE
Blood, UA: NEGATIVE
Glucose, UA: NEGATIVE
Ketones, UA: NEGATIVE
Leukocytes, UA: NEGATIVE
Nitrite, UA: NEGATIVE
Spec Grav, UA: 1.015
Urobilinogen, UA: NEGATIVE
pH, UA: 7

## 2014-02-05 NOTE — Progress Notes (Signed)
Pulse: 97

## 2014-02-07 LAB — STREP B DNA PROBE: GBSP: NEGATIVE

## 2014-02-11 ENCOUNTER — Ambulatory Visit (INDEPENDENT_AMBULATORY_CARE_PROVIDER_SITE_OTHER): Payer: BC Managed Care – PPO | Admitting: Obstetrics

## 2014-02-11 VITALS — BP 140/89 | Temp 96.8°F | Wt 299.0 lb

## 2014-02-11 DIAGNOSIS — Z3480 Encounter for supervision of other normal pregnancy, unspecified trimester: Secondary | ICD-10-CM

## 2014-02-11 LAB — POCT URINALYSIS DIPSTICK
BILIRUBIN UA: NEGATIVE
Glucose, UA: NEGATIVE
Ketones, UA: NEGATIVE
LEUKOCYTES UA: NEGATIVE
Nitrite, UA: NEGATIVE
PH UA: 6.5
RBC UA: NEGATIVE
Spec Grav, UA: 1.015
Urobilinogen, UA: NEGATIVE

## 2014-02-11 NOTE — Progress Notes (Signed)
Pulse- 96 

## 2014-02-12 ENCOUNTER — Encounter: Payer: Self-pay | Admitting: Obstetrics

## 2014-02-19 ENCOUNTER — Encounter: Payer: Self-pay | Admitting: Obstetrics

## 2014-02-19 ENCOUNTER — Ambulatory Visit (INDEPENDENT_AMBULATORY_CARE_PROVIDER_SITE_OTHER): Payer: BC Managed Care – PPO | Admitting: Obstetrics

## 2014-02-19 VITALS — BP 130/89 | Temp 99.0°F | Wt 302.0 lb

## 2014-02-19 DIAGNOSIS — Z3480 Encounter for supervision of other normal pregnancy, unspecified trimester: Secondary | ICD-10-CM

## 2014-02-19 LAB — POCT URINALYSIS DIPSTICK
Bilirubin, UA: NEGATIVE
GLUCOSE UA: NEGATIVE
Ketones, UA: NEGATIVE
Leukocytes, UA: NEGATIVE
Nitrite, UA: NEGATIVE
PH UA: 6
RBC UA: NEGATIVE
SPEC GRAV UA: 1.02
Urobilinogen, UA: NEGATIVE

## 2014-02-19 NOTE — Progress Notes (Signed)
Pulse: 88

## 2014-02-23 ENCOUNTER — Encounter (HOSPITAL_COMMUNITY): Payer: Self-pay | Admitting: Pharmacist

## 2014-02-26 ENCOUNTER — Ambulatory Visit (INDEPENDENT_AMBULATORY_CARE_PROVIDER_SITE_OTHER): Payer: BC Managed Care – PPO | Admitting: Obstetrics

## 2014-02-26 VITALS — BP 138/88 | Temp 97.8°F | Wt 305.0 lb

## 2014-02-26 DIAGNOSIS — Z3480 Encounter for supervision of other normal pregnancy, unspecified trimester: Secondary | ICD-10-CM

## 2014-02-26 LAB — POCT URINALYSIS DIPSTICK
Bilirubin, UA: NEGATIVE
GLUCOSE UA: NEGATIVE
KETONES UA: NEGATIVE
Leukocytes, UA: NEGATIVE
Nitrite, UA: NEGATIVE
Protein, UA: NEGATIVE
RBC UA: NEGATIVE
SPEC GRAV UA: 1.015
Urobilinogen, UA: NEGATIVE
pH, UA: 6

## 2014-02-26 NOTE — Progress Notes (Signed)
P 93 Patient states she may be having contractions- she is definitely having pressure.

## 2014-03-01 ENCOUNTER — Encounter (HOSPITAL_COMMUNITY): Payer: Self-pay | Admitting: *Deleted

## 2014-03-01 ENCOUNTER — Inpatient Hospital Stay (HOSPITAL_COMMUNITY)
Admission: AD | Admit: 2014-03-01 | Discharge: 2014-03-05 | DRG: 765 | Disposition: A | Discharge status: Home / Self Care | Payer: BC Managed Care – PPO | Source: Ambulatory Visit | Attending: Obstetrics & Gynecology | Admitting: Obstetrics & Gynecology

## 2014-03-01 DIAGNOSIS — Z8759 Personal history of other complications of pregnancy, childbirth and the puerperium: Secondary | ICD-10-CM

## 2014-03-01 DIAGNOSIS — Z87891 Personal history of nicotine dependence: Secondary | ICD-10-CM

## 2014-03-01 DIAGNOSIS — O99214 Obesity complicating childbirth: Secondary | ICD-10-CM

## 2014-03-01 DIAGNOSIS — Z302 Encounter for sterilization: Secondary | ICD-10-CM

## 2014-03-01 DIAGNOSIS — Z98891 History of uterine scar from previous surgery: Secondary | ICD-10-CM

## 2014-03-01 DIAGNOSIS — E559 Vitamin D deficiency, unspecified: Secondary | ICD-10-CM

## 2014-03-01 DIAGNOSIS — Z833 Family history of diabetes mellitus: Secondary | ICD-10-CM

## 2014-03-01 DIAGNOSIS — Z3480 Encounter for supervision of other normal pregnancy, unspecified trimester: Secondary | ICD-10-CM

## 2014-03-01 DIAGNOSIS — D649 Anemia, unspecified: Secondary | ICD-10-CM | POA: Diagnosis present

## 2014-03-01 DIAGNOSIS — Z8249 Family history of ischemic heart disease and other diseases of the circulatory system: Secondary | ICD-10-CM

## 2014-03-01 DIAGNOSIS — O1002 Pre-existing essential hypertension complicating childbirth: Secondary | ICD-10-CM | POA: Diagnosis present

## 2014-03-01 DIAGNOSIS — O9902 Anemia complicating childbirth: Secondary | ICD-10-CM | POA: Diagnosis present

## 2014-03-01 DIAGNOSIS — O280 Abnormal hematological finding on antenatal screening of mother: Secondary | ICD-10-CM

## 2014-03-01 DIAGNOSIS — E669 Obesity, unspecified: Secondary | ICD-10-CM | POA: Diagnosis present

## 2014-03-01 DIAGNOSIS — O34219 Maternal care for unspecified type scar from previous cesarean delivery: Principal | ICD-10-CM | POA: Diagnosis present

## 2014-03-01 HISTORY — DX: Gestational (pregnancy-induced) hypertension without significant proteinuria, unspecified trimester: O13.9

## 2014-03-02 ENCOUNTER — Encounter: Payer: Self-pay | Admitting: Obstetrics

## 2014-03-02 ENCOUNTER — Encounter (HOSPITAL_COMMUNITY): Payer: Self-pay | Admitting: Obstetrics

## 2014-03-02 ENCOUNTER — Inpatient Hospital Stay (HOSPITAL_COMMUNITY): Payer: BC Managed Care – PPO | Admitting: Anesthesiology

## 2014-03-02 ENCOUNTER — Encounter (HOSPITAL_COMMUNITY): Payer: BC Managed Care – PPO | Admitting: Anesthesiology

## 2014-03-02 ENCOUNTER — Encounter (HOSPITAL_COMMUNITY)
Admission: AD | Disposition: A | Discharge status: Home / Self Care | Payer: Self-pay | Source: Ambulatory Visit | Attending: Obstetrics & Gynecology

## 2014-03-02 DIAGNOSIS — Z302 Encounter for sterilization: Secondary | ICD-10-CM

## 2014-03-02 LAB — TYPE AND SCREEN
ABO/RH(D): O POS
ANTIBODY SCREEN: NEGATIVE

## 2014-03-02 LAB — CBC
HEMATOCRIT: 31.4 % — AB (ref 36.0–46.0)
Hemoglobin: 9.7 g/dL — ABNORMAL LOW (ref 12.0–15.0)
MCH: 22.2 pg — ABNORMAL LOW (ref 26.0–34.0)
MCHC: 30.9 g/dL (ref 30.0–36.0)
MCV: 72 fL — ABNORMAL LOW (ref 78.0–100.0)
Platelets: 309 10*3/uL (ref 150–400)
RBC: 4.36 MIL/uL (ref 3.87–5.11)
RDW: 17.1 % — AB (ref 11.5–15.5)
WBC: 9.2 10*3/uL (ref 4.0–10.5)

## 2014-03-02 LAB — COMPREHENSIVE METABOLIC PANEL
ALK PHOS: 136 U/L — AB (ref 39–117)
ALT: 5 U/L (ref 0–35)
AST: 12 U/L (ref 0–37)
Albumin: 2.3 g/dL — ABNORMAL LOW (ref 3.5–5.2)
BUN: 6 mg/dL (ref 6–23)
CHLORIDE: 103 meq/L (ref 96–112)
CO2: 21 meq/L (ref 19–32)
CREATININE: 0.64 mg/dL (ref 0.50–1.10)
Calcium: 9 mg/dL (ref 8.4–10.5)
GFR calc Af Amer: >90 mL/min (ref >90)
Glucose, Bld: 82 mg/dL (ref 70–99)
Potassium: 4.2 mEq/L (ref 3.7–5.3)
SODIUM: 138 meq/L (ref 137–147)
Total Protein: 5.8 g/dL — ABNORMAL LOW (ref 6.0–8.3)

## 2014-03-02 LAB — RPR: RPR: NONREACTIVE

## 2014-03-02 LAB — LACTATE DEHYDROGENASE: LDH: 172 U/L (ref 94–250)

## 2014-03-02 SURGERY — Surgical Case
Anesthesia: Epidural | Site: Abdomen

## 2014-03-02 MED ORDER — DIBUCAINE 1 % RE OINT: 1.0000 "application " | TOPICAL_OINTMENT | RECTAL | Status: DC | PRN

## 2014-03-02 MED ORDER — KETOROLAC TROMETHAMINE 30 MG/ML IJ SOLN
INTRAMUSCULAR | Status: AC
  Administered 2014-03-02: 30 mg via INTRAVENOUS
  Filled 2014-03-02: qty 1

## 2014-03-02 MED ORDER — LACTATED RINGERS IV SOLN
INTRAVENOUS | Status: DC | PRN
  Administered 2014-03-02 (×2): via INTRAVENOUS

## 2014-03-02 MED ORDER — LANOLIN HYDROUS EX OINT: 1.0000 "application " | TOPICAL_OINTMENT | CUTANEOUS | Status: DC | PRN

## 2014-03-02 MED ORDER — OXYTOCIN 10 UNIT/ML IJ SOLN
INTRAMUSCULAR | Status: AC
  Filled 2014-03-02: qty 4

## 2014-03-02 MED ORDER — PRENATAL MULTIVITAMIN CH: 1.0000 | ORAL_TABLET | Freq: Every day | ORAL | Status: DC

## 2014-03-02 MED ORDER — FENTANYL CITRATE 0.05 MG/ML IJ SOLN
INTRAMUSCULAR | Status: AC
  Administered 2014-03-02: 50 ug via INTRAVENOUS
  Filled 2014-03-02: qty 2

## 2014-03-02 MED ORDER — IBUPROFEN 600 MG PO TABS
600.0000 mg | ORAL_TABLET | Freq: Four times a day (QID) | ORAL | Status: DC
  Administered 2014-03-02 – 2014-03-05 (×11): 600 mg via ORAL
  Filled 2014-03-02 (×11): qty 1

## 2014-03-02 MED ORDER — CALCIUM CARBONATE ANTACID 500 MG PO CHEW: 2.0000 | CHEWABLE_TABLET | ORAL | Status: DC | PRN

## 2014-03-02 MED ORDER — LACTATED RINGERS IV SOLN
INTRAVENOUS | Status: DC | PRN
Start: 2014-03-02 — End: 2014-03-02
  Administered 2014-03-02: 10:00:00 via INTRAVENOUS

## 2014-03-02 MED ORDER — NALOXONE HCL 0.4 MG/ML IJ SOLN: 0.4000 mg | INTRAMUSCULAR | Status: DC | PRN

## 2014-03-02 MED ORDER — CEFAZOLIN SODIUM 1-5 GM-% IV SOLN
1.0000 g | Freq: Once | INTRAVENOUS | Status: AC
  Administered 2014-03-02: 1 g via INTRAVENOUS
  Filled 2014-03-02: qty 50

## 2014-03-02 MED ORDER — PHENYLEPHRINE 8 MG IN D5W 100 ML (0.08MG/ML) PREMIX OPTIME
INJECTION | INTRAVENOUS | Status: DC | PRN
  Administered 2014-03-02: 60 ug/min via INTRAVENOUS

## 2014-03-02 MED ORDER — FENTANYL CITRATE 0.05 MG/ML IJ SOLN
INTRAMUSCULAR | Status: DC | PRN
  Administered 2014-03-02 (×2): 50 ug via INTRAVENOUS

## 2014-03-02 MED ORDER — SODIUM CHLORIDE 0.9 % IJ SOLN: 3.0000 mL | INTRAMUSCULAR | Status: DC | PRN

## 2014-03-02 MED ORDER — ONDANSETRON HCL 4 MG/2ML IJ SOLN
4.0000 mg | INTRAMUSCULAR | Status: DC | PRN
  Administered 2014-03-02: 4 mg via INTRAVENOUS
  Filled 2014-03-02: qty 2

## 2014-03-02 MED ORDER — ONDANSETRON HCL 4 MG/2ML IJ SOLN: 4.0000 mg | Freq: Three times a day (TID) | INTRAMUSCULAR | Status: DC | PRN

## 2014-03-02 MED ORDER — MEASLES, MUMPS & RUBELLA VAC ~~LOC~~ INJ
0.5000 mL | INJECTION | Freq: Once | SUBCUTANEOUS | Status: DC
  Filled 2014-03-02: qty 0.5

## 2014-03-02 MED ORDER — FENTANYL CITRATE 0.05 MG/ML IJ SOLN
INTRAMUSCULAR | Status: AC
  Filled 2014-03-02: qty 2

## 2014-03-02 MED ORDER — TETANUS-DIPHTH-ACELL PERTUSSIS 5-2.5-18.5 LF-MCG/0.5 IM SUSP: 0.5000 mL | Freq: Once | INTRAMUSCULAR | Status: DC

## 2014-03-02 MED ORDER — OXYCODONE-ACETAMINOPHEN 5-325 MG PO TABS
1.0000 | ORAL_TABLET | ORAL | Status: DC | PRN
  Administered 2014-03-03 – 2014-03-04 (×4): 1 via ORAL
  Filled 2014-03-02 (×4): qty 1

## 2014-03-02 MED ORDER — NALBUPHINE HCL 10 MG/ML IJ SOLN: 5.0000 mg | INTRAMUSCULAR | Status: DC | PRN

## 2014-03-02 MED ORDER — KETOROLAC TROMETHAMINE 30 MG/ML IJ SOLN
30.0000 mg | Freq: Four times a day (QID) | INTRAMUSCULAR | Status: AC | PRN
  Administered 2014-03-02: 30 mg via INTRAVENOUS

## 2014-03-02 MED ORDER — SIMETHICONE 80 MG PO CHEW
80.0000 mg | CHEWABLE_TABLET | ORAL | Status: DC
  Administered 2014-03-02 – 2014-03-04 (×4): 80 mg via ORAL
  Filled 2014-03-02 (×3): qty 1

## 2014-03-02 MED ORDER — SCOPOLAMINE 1 MG/3DAYS TD PT72
1.0000 | MEDICATED_PATCH | Freq: Once | TRANSDERMAL | Status: AC
  Administered 2014-03-02: 1.5 mg via TRANSDERMAL

## 2014-03-02 MED ORDER — CITRIC ACID-SODIUM CITRATE 334-500 MG/5ML PO SOLN
30.0000 mL | Freq: Once | ORAL | Status: AC
  Administered 2014-03-02: 30 mL via ORAL
  Filled 2014-03-02: qty 15

## 2014-03-02 MED ORDER — DIPHENHYDRAMINE HCL 50 MG/ML IJ SOLN: 12.5000 mg | INTRAMUSCULAR | Status: DC | PRN

## 2014-03-02 MED ORDER — DIPHENHYDRAMINE HCL 25 MG PO CAPS: 25.0000 mg | ORAL_CAPSULE | ORAL | Status: DC | PRN

## 2014-03-02 MED ORDER — OXYTOCIN 40 UNITS IN LACTATED RINGERS INFUSION - SIMPLE MED: 62.5000 mL/h | INTRAVENOUS | Status: AC

## 2014-03-02 MED ORDER — ENOXAPARIN SODIUM 40 MG/0.4ML ~~LOC~~ SOLN
40.0000 mg | SUBCUTANEOUS | Status: DC
  Administered 2014-03-03 – 2014-03-05 (×3): 40 mg via SUBCUTANEOUS
  Filled 2014-03-02 (×3): qty 0.4

## 2014-03-02 MED ORDER — LACTATED RINGERS IV SOLN
INTRAVENOUS | Status: DC
  Administered 2014-03-02 (×2): via INTRAVENOUS

## 2014-03-02 MED ORDER — DOCUSATE SODIUM 100 MG PO CAPS: 100.0000 mg | ORAL_CAPSULE | Freq: Every day | ORAL | Status: DC

## 2014-03-02 MED ORDER — PHENYLEPHRINE 8 MG IN D5W 100 ML (0.08MG/ML) PREMIX OPTIME
INJECTION | INTRAVENOUS | Status: AC
  Filled 2014-03-02: qty 100

## 2014-03-02 MED ORDER — MORPHINE SULFATE 0.5 MG/ML IJ SOLN
INTRAMUSCULAR | Status: AC
  Filled 2014-03-02: qty 10

## 2014-03-02 MED ORDER — DIPHENHYDRAMINE HCL 50 MG/ML IJ SOLN
25.0000 mg | INTRAMUSCULAR | Status: DC | PRN
Start: 2014-03-02 — End: 2014-03-05

## 2014-03-02 MED ORDER — ACETAMINOPHEN 325 MG PO TABS: 650.0000 mg | ORAL_TABLET | ORAL | Status: DC | PRN

## 2014-03-02 MED ORDER — BUTORPHANOL TARTRATE 1 MG/ML IJ SOLN
2.0000 mg | INTRAMUSCULAR | Status: DC | PRN
  Administered 2014-03-02: 2 mg via INTRAVENOUS
  Filled 2014-03-02: qty 2

## 2014-03-02 MED ORDER — OXYTOCIN 10 UNIT/ML IJ SOLN
40.0000 [IU] | INTRAVENOUS | Status: DC | PRN
  Administered 2014-03-02: 40 [IU] via INTRAVENOUS

## 2014-03-02 MED ORDER — ZOLPIDEM TARTRATE 5 MG PO TABS: 5.0000 mg | ORAL_TABLET | Freq: Every evening | ORAL | Status: DC | PRN

## 2014-03-02 MED ORDER — FENTANYL CITRATE 0.05 MG/ML IJ SOLN
25.0000 ug | INTRAMUSCULAR | Status: DC | PRN
  Administered 2014-03-02 (×3): 50 ug via INTRAVENOUS

## 2014-03-02 MED ORDER — FERROUS SULFATE 325 (65 FE) MG PO TABS
325.0000 mg | ORAL_TABLET | Freq: Two times a day (BID) | ORAL | Status: DC
  Administered 2014-03-03 – 2014-03-04 (×4): 325 mg via ORAL
  Filled 2014-03-02 (×5): qty 1

## 2014-03-02 MED ORDER — BUTORPHANOL TARTRATE 1 MG/ML IJ SOLN
1.0000 mg | Freq: Once | INTRAMUSCULAR | Status: AC
  Administered 2014-03-02: 1 mg via INTRAVENOUS
  Filled 2014-03-02: qty 1

## 2014-03-02 MED ORDER — SIMETHICONE 80 MG PO CHEW
80.0000 mg | CHEWABLE_TABLET | ORAL | Status: DC | PRN
  Administered 2014-03-04: 80 mg via ORAL
  Filled 2014-03-02 (×2): qty 1

## 2014-03-02 MED ORDER — ONDANSETRON HCL 4 MG/2ML IJ SOLN
INTRAMUSCULAR | Status: DC | PRN
  Administered 2014-03-02: 4 mg via INTRAVENOUS

## 2014-03-02 MED ORDER — DEXTROSE 5 % IV SOLN
500.0000 mg | Freq: Once | INTRAVENOUS | Status: AC
  Administered 2014-03-02: 250 mg via INTRAVENOUS
  Filled 2014-03-02: qty 500

## 2014-03-02 MED ORDER — CEFAZOLIN SODIUM-DEXTROSE 2-3 GM-% IV SOLR
2.0000 g | Freq: Once | INTRAVENOUS | Status: AC
  Administered 2014-03-02: 2 g via INTRAVENOUS
  Filled 2014-03-02: qty 50

## 2014-03-02 MED ORDER — LACTATED RINGERS IV SOLN
INTRAVENOUS | Status: DC
  Administered 2014-03-02: 18:00:00 via INTRAVENOUS

## 2014-03-02 MED ORDER — ONDANSETRON HCL 4 MG PO TABS
4.0000 mg | ORAL_TABLET | ORAL | Status: DC | PRN
  Administered 2014-03-05: 4 mg via ORAL
  Filled 2014-03-02: qty 1

## 2014-03-02 MED ORDER — PRENATAL MULTIVITAMIN CH
1.0000 | ORAL_TABLET | Freq: Every day | ORAL | Status: DC
  Administered 2014-03-03 – 2014-03-04 (×2): 1 via ORAL
  Filled 2014-03-02 (×2): qty 1

## 2014-03-02 MED ORDER — MAGNESIUM HYDROXIDE 400 MG/5ML PO SUSP: 30.0000 mL | ORAL | Status: DC | PRN

## 2014-03-02 MED ORDER — ONDANSETRON HCL 4 MG/2ML IJ SOLN
INTRAMUSCULAR | Status: AC
  Filled 2014-03-02: qty 2

## 2014-03-02 MED ORDER — SCOPOLAMINE 1 MG/3DAYS TD PT72
MEDICATED_PATCH | TRANSDERMAL | Status: AC
  Administered 2014-03-02: 1.5 mg via TRANSDERMAL
  Filled 2014-03-02: qty 1

## 2014-03-02 MED ORDER — MEPERIDINE HCL 25 MG/ML IJ SOLN: 6.2500 mg | INTRAMUSCULAR | Status: DC | PRN

## 2014-03-02 MED ORDER — METOCLOPRAMIDE HCL 5 MG/ML IJ SOLN: 10.0000 mg | Freq: Three times a day (TID) | INTRAMUSCULAR | Status: DC | PRN

## 2014-03-02 MED ORDER — DEXTROSE 5 % IV SOLN
1.0000 ug/kg/h | INTRAVENOUS | Status: DC | PRN
  Filled 2014-03-02: qty 2

## 2014-03-02 MED ORDER — KETOROLAC TROMETHAMINE 30 MG/ML IJ SOLN: 30.0000 mg | Freq: Four times a day (QID) | INTRAMUSCULAR | Status: AC | PRN

## 2014-03-02 MED ORDER — WITCH HAZEL-GLYCERIN EX PADS: 1.0000 "application " | MEDICATED_PAD | CUTANEOUS | Status: DC | PRN

## 2014-03-02 MED ORDER — SENNOSIDES-DOCUSATE SODIUM 8.6-50 MG PO TABS
2.0000 | ORAL_TABLET | ORAL | Status: DC
  Administered 2014-03-02 – 2014-03-04 (×3): 2 via ORAL
  Filled 2014-03-02 (×3): qty 2

## 2014-03-02 MED ORDER — PHENYLEPHRINE 40 MCG/ML (10ML) SYRINGE FOR IV PUSH (FOR BLOOD PRESSURE SUPPORT)
PREFILLED_SYRINGE | INTRAVENOUS | Status: AC
  Filled 2014-03-02: qty 10

## 2014-03-02 MED ORDER — DIPHENHYDRAMINE HCL 25 MG PO CAPS: 25.0000 mg | ORAL_CAPSULE | Freq: Four times a day (QID) | ORAL | Status: DC | PRN

## 2014-03-02 SURGICAL SUPPLY — 40 items
BENZOIN TINCTURE PRP APPL 2/3 (GAUZE/BANDAGES/DRESSINGS) ×3 IMPLANT
CANISTER WOUND CARE 500ML ATS (WOUND CARE) IMPLANT
CLAMP CORD UMBIL (MISCELLANEOUS) IMPLANT
CLOTH BEACON ORANGE TIMEOUT ST (SAFETY) ×3 IMPLANT
CONTAINER PREFILL 10% NBF 15ML (MISCELLANEOUS) IMPLANT
DRAPE LG THREE QUARTER DISP (DRAPES) IMPLANT
DRESSING DISP NPWT PICO 4X12 (MISCELLANEOUS) ×3 IMPLANT
DRSG OPSITE POSTOP 4X10 (GAUZE/BANDAGES/DRESSINGS) ×3 IMPLANT
DRSG VAC ATS LRG SENSATRAC (GAUZE/BANDAGES/DRESSINGS) IMPLANT
DRSG VAC ATS MED SENSATRAC (GAUZE/BANDAGES/DRESSINGS) IMPLANT
DRSG VAC ATS SM SENSATRAC (GAUZE/BANDAGES/DRESSINGS) IMPLANT
DURAPREP 26ML APPLICATOR (WOUND CARE) ×3 IMPLANT
ELECT REM PT RETURN 9FT ADLT (ELECTROSURGICAL) ×3
ELECTRODE REM PT RTRN 9FT ADLT (ELECTROSURGICAL) ×2 IMPLANT
EXTRACTOR VACUUM M CUP 4 TUBE (SUCTIONS) IMPLANT
GLOVE BIO SURGEON STRL SZ 6.5 (GLOVE) ×3 IMPLANT
GOWN STRL REUS W/TWL LRG LVL3 (GOWN DISPOSABLE) ×6 IMPLANT
KIT ABG SYR 3ML LUER SLIP (SYRINGE) IMPLANT
NEEDLE HYPO 25X5/8 SAFETYGLIDE (NEEDLE) ×3 IMPLANT
NS IRRIG 1000ML POUR BTL (IV SOLUTION) ×3 IMPLANT
PACK C SECTION WH (CUSTOM PROCEDURE TRAY) ×3 IMPLANT
PAD OB MATERNITY 4.3X12.25 (PERSONAL CARE ITEMS) ×3 IMPLANT
RTRCTR C-SECT PINK 25CM LRG (MISCELLANEOUS) ×3 IMPLANT
SCRUB PCMX 4 OZ (MISCELLANEOUS) ×6 IMPLANT
STAPLER VISISTAT 35W (STAPLE) IMPLANT
STRIP CLOSURE SKIN 1/2X4 (GAUZE/BANDAGES/DRESSINGS) ×3 IMPLANT
SUT MNCRL 0 VIOLET CTX 36 (SUTURE) ×4 IMPLANT
SUT MNCRL AB 3-0 PS2 27 (SUTURE) ×3 IMPLANT
SUT MONOCRYL 0 CTX 36 (SUTURE) ×2
SUT PDS AB 0 CTX 36 PDP370T (SUTURE) ×6 IMPLANT
SUT PLAIN 0 NONE (SUTURE) IMPLANT
SUT VIC AB 0 CTXB 36 (SUTURE) IMPLANT
SUT VIC AB 2-0 CT1 (SUTURE) ×3 IMPLANT
SUT VIC AB 2-0 CT1 27 (SUTURE) ×1
SUT VIC AB 2-0 CT1 TAPERPNT 27 (SUTURE) ×2 IMPLANT
SUT VIC AB 2-0 SH 27 (SUTURE)
SUT VIC AB 2-0 SH 27XBRD (SUTURE) IMPLANT
TOWEL OR 17X24 6PK STRL BLUE (TOWEL DISPOSABLE) ×3 IMPLANT
TRAY FOLEY CATH 14FR (SET/KITS/TRAYS/PACK) ×3 IMPLANT
WATER STERILE IRR 1000ML POUR (IV SOLUTION) IMPLANT

## 2014-03-02 NOTE — H&P (Signed)
Tabitha HellerShakia L Obrien is a 28 y.o. female presenting for contractions. Maternal Medical History:  Reason for admission: Contractions.   Contractions: Onset was 6-12 hours ago.    Fetal activity: Perceived fetal activity is normal.    Prenatal complications: no prenatal complications Prenatal Complications - Diabetes: none.    OB History   Grav Para Term Preterm Abortions TAB SAB Ect Mult Living   6 2 2  3 2 1   2      Past Medical History  Diagnosis Date  . No pertinent past medical history   . Medical history non-contributory   . Pregnancy induced hypertension    Past Surgical History  Procedure Laterality Date  . Cesarean section    . Dilation and curettage of uterus    . Wisdom tooth extraction     Family History: family history includes Diabetes in her mother; Hypertension in her father. Social History:  reports that she has quit smoking. She has never used smokeless tobacco. She reports that she does not drink alcohol or use illicit drugs.     Review of Systems  Constitutional: Negative for fever.  Eyes: Negative for blurred vision.  Respiratory: Negative for shortness of breath.   Gastrointestinal: Negative for vomiting.  Skin: Negative for rash.  Neurological: Negative for headaches.    Dilation: Closed Effacement (%): 50 Station: -3 Exam by:: K Fraley RN Blood pressure 138/76, pulse 80, temperature 97.7 F (36.5 C), temperature source Oral, resp. rate 18, height 5\' 5"  (1.651 m), weight 138.347 kg (305 lb), last menstrual period 05/31/2013. Maternal Exam:  Uterine Assessment: Contraction frequency is irregular.   Abdomen: not evaluated.  Introitus: not evaluated.   Cervix: Cervix evaluated by digital exam.     Fetal Exam Fetal Monitor Review: Variability: moderate (6-25 bpm).   Pattern: no decelerations and accelerations present.    Fetal State Assessment: Category I - tracings are normal.     Physical Exam  Constitutional: She appears  well-developed.  HENT:  Head: Normocephalic.  Neck: Neck supple. No thyromegaly present.  Cardiovascular: Normal rate and regular rhythm.   Respiratory: Breath sounds normal.  GI: Soft. Bowel sounds are normal.  Skin: No rash noted.    Prenatal labs: ABO, Rh: --/--/O POS (04/06 0153) Antibody: NEG (04/06 0153) Rubella: 3.12 (09/11 1700) RPR: NON REAC (01/09 1031)  HBsAg: NEGATIVE (09/11 1700)  HIV: NON REACTIVE (01/09 1031)  GBS: NEGATIVE (03/12 1740)   Assessment/Plan: IUP @ 2241w2d.  H/O C/D x 2.  Prodromal labor.  H/O chronic HTN--no meds/?gestational hypertension  Admit Repeat C/D   JACKSON-MOORE,Inari Shin A 03/02/2014, 8:17 AM

## 2014-03-02 NOTE — Anesthesia Postprocedure Evaluation (Signed)
  Anesthesia Post-op Note  Patient: Tabitha HellerShakia L Godfrey  Procedure(s) Performed: Procedure(s): Repeat CESAREAN SECTION WITH BILATERAL TUBAL LIGATION  Patient is awake, responsive, moving her legs, and has signs of resolution of her numbness. Pain and nausea are reasonably well controlled. Vital signs are stable and clinically acceptable. Oxygen saturation is clinically acceptable. There are no apparent anesthetic complications at this time. Patient is ready for discharge.

## 2014-03-02 NOTE — Transfer of Care (Signed)
Immediate Anesthesia Transfer of Care Note  Patient: Tabitha HellerShakia L Obrien  Procedure(s) Performed: Procedure(s): Repeat CESAREAN SECTION WITH BILATERAL TUBAL LIGATION  Patient Location: PACU  Anesthesia Type:Spinal and Epidural  Level of Consciousness: awake, alert  and oriented  Airway & Oxygen Therapy: Patient connected to nasal cannula oxygen  Post-op Assessment: Report given to PACU RN and Post -op Vital signs reviewed and stable  Post vital signs: Reviewed and stable  Complications: No apparent anesthesia complications

## 2014-03-02 NOTE — Anesthesia Preprocedure Evaluation (Addendum)
Anesthesia Evaluation  Patient identified by MRN, date of birth, ID band Patient awake    Reviewed: Allergy & Precautions, H&P , Patient's Chart, lab work & pertinent test results  Airway Mallampati: II TM Distance: >3 FB Neck ROM: full    Dental  (+) Teeth Intact   Pulmonary former smoker,  breath sounds clear to auscultation        Cardiovascular hypertension, Rhythm:regular Rate:Normal     Neuro/Psych    GI/Hepatic   Endo/Other  Morbid obesity  Renal/GU      Musculoskeletal   Abdominal   Peds  Hematology  (+) anemia ,   Anesthesia Other Findings       Reproductive/Obstetrics (+) Pregnancy                         Anesthesia Physical Anesthesia Plan  ASA: III  Anesthesia Plan: Spinal, Combined Spinal and Epidural and Epidural   Post-op Pain Management:    Induction:   Airway Management Planned:   Additional Equipment:   Intra-op Plan:   Post-operative Plan:   Informed Consent: I have reviewed the patients History and Physical, chart, labs and discussed the procedure including the risks, benefits and alternatives for the proposed anesthesia with the patient or authorized representative who has indicated his/her understanding and acceptance.   Dental Advisory Given  Plan Discussed with: CRNA  Anesthesia Plan Comments: (Lab work confirmed with CRNA in room. Platelets okay. Discussed spinal anesthetic, and patient consents to the procedure:  included risk of possible headache,backache, failed block, allergic reaction, and nerve injury. This patient was asked if she had any questions or concerns before the procedure started. )        Anesthesia Quick Evaluation

## 2014-03-02 NOTE — Anesthesia Procedure Notes (Signed)
Spinal  Patient location during procedure: OR Preanesthetic Checklist Completed: patient identified, site marked, surgical consent, pre-op evaluation, timeout performed, IV checked, risks and benefits discussed and monitors and equipment checked Spinal Block Patient position: sitting Prep: DuraPrep Patient monitoring: cardiac monitor, continuous pulse ox, blood pressure and heart rate Approach: midline Location: L3-4 Injection technique: catheter Needle Needle type: Tuohy and Sprotte  Needle gauge: 24 G Needle length: 12.7 cm Needle insertion depth: 9 cm Catheter type: closed end flexible Catheter size: 19 g Catheter at skin depth: 16 cm Additional Notes Spinal Dosage in OR  Bupivicaine ml       1.5 PFMS04   mcg        150 (-) asp Heme/CSF

## 2014-03-02 NOTE — Op Note (Signed)
Cesarean Section Procedure Note   [name]   03/01/2014 - 03/02/2014  Indications: Intrauterine pregnancy at 39 weeks, history of previoius cesarean x 2, desires sterilization   Pre-operative Diagnosis: Intrauterine pregnancy at 39 weeks, history of previoius cesarean x 2, desires sterilization   Post-operative Diagnosis: Same   Surgeon: Roseanna RainbowJACKSON-MOORE,Ethal Gotay A  Assistants:  Francoise CeoMARSHALL, BERNARD  Anesthesia: spinal  Procedure Details:  The patient was seen in the Holding Room. The risks, benefits, complications, treatment options, and expected outcomes were discussed with the patient. The patient concurred with the proposed plan, giving informed consent. The patient was identified as [name] and the procedure verified as C-Section Delivery. A Time Out was held and the above information confirmed.  After induction of anesthesia, the patient was draped and prepped in the usual sterile manner. A transverse incision was made and carried down through the subcutaneous tissue to the fascia. The fascial incision was made and extended transversely. The fascia was separated from the underlying rectus tissue superiorly. The peritoneum was identified and entered. The peritoneal incision was extended longitudinally. An Alexis retractor was placed into the incision.  The utero-vesical peritoneal reflection was incised transversely and the bladder flap was bluntly freed from the lower uterine segment. A low transverse uterine incision was made. Delivered from cephalic presentation was a living newborn female infant.  A cord pH was sent. The umbilical cord was clamped and cut cord. A sample was obtained for evaluation. The placenta was removed Intact and appeared normal.  The uterine incision was closed with running locked sutures of 1-0 Monocryl.  The mid isthmic portion of the left fallopian was grasped.  Two ligatures of 0 - Plain were placed and a 1 - 2 cm segment of tube was excised.  The right  fallopian tube was manipulated in a similar fashion.  Hemostasis was observed. The paracolic gutters were irrigated. The parieto peritoneum was closed in a running fashion with 2-0 Vicryl.  The fascia was then reapproximated with running sutures of 0 PDS.  A running suture of 2-0 Vicryl was placed in the subcutaneous layer.  The skin was closed with suture.  Instrument, sponge, and needle counts were correct prior the abdominal closure and were correct at the conclusion of the case.    Findings:  See above.  Meconium stained fluid   Estimated Blood Loss: 600 ml  Total IV Fluids: per Anesthesiology   Urine Output: per Anesthesiology  Specimens: Placenta, portions of fallopian tubes to Pathology   Complications: no complications  Disposition: PACU - hemodynamically stable.  Maternal Condition: stable   Baby condition / location:  Couplet care / Skin to Skin    Signed: Surgeon(s): Antionette CharLisa Jackson-Moore, MD Kathreen CosierBernard A Marshall, MD

## 2014-03-03 ENCOUNTER — Encounter (HOSPITAL_COMMUNITY): Payer: Self-pay | Admitting: Anesthesiology

## 2014-03-03 ENCOUNTER — Encounter (HOSPITAL_COMMUNITY): Payer: Self-pay | Admitting: Obstetrics & Gynecology

## 2014-03-03 ENCOUNTER — Inpatient Hospital Stay (HOSPITAL_COMMUNITY): Admission: RE | Admit: 2014-03-03 | Payer: BC Managed Care – PPO | Source: Ambulatory Visit

## 2014-03-03 LAB — CBC
HCT: 27.9 % — ABNORMAL LOW (ref 36.0–46.0)
HEMOGLOBIN: 8.4 g/dL — AB (ref 12.0–15.0)
MCH: 21.9 pg — ABNORMAL LOW (ref 26.0–34.0)
MCHC: 30.1 g/dL (ref 30.0–36.0)
MCV: 72.8 fL — ABNORMAL LOW (ref 78.0–100.0)
Platelets: 291 10*3/uL (ref 150–400)
RBC: 3.83 MIL/uL — AB (ref 3.87–5.11)
RDW: 17.5 % — ABNORMAL HIGH (ref 11.5–15.5)
WBC: 10.5 10*3/uL (ref 4.0–10.5)

## 2014-03-03 NOTE — Progress Notes (Signed)
Post Partum Day 1 Subjective: no complaints, up ad lib, voiding, tolerating PO and + flatus  Objective: Blood pressure 100/67, pulse 96, temperature 98.9 F (37.2 C), temperature source Oral, resp. rate 20, height 5\' 5"  (1.651 m), weight 305 lb (138.347 kg), last menstrual period 05/31/2013, SpO2 97.00%, unknown if currently breastfeeding.  Physical Exam:  General: alert, cooperative and appears stated age Lochia: appropriate Uterine Fundus: firm Incision: no significant drainage, no significant erythema DVT Evaluation: No evidence of DVT seen on physical exam.   Recent Labs  03/02/14 0153 03/03/14 0553  HGB 9.7* 8.4*  HCT 31.4* 27.9*    Assessment/Plan: Encouraged breast feeding.  Reviewed diet and supplementation for anemia.  Wound Vac in place.   LOS: 2 days   [facility] HospitalWREN, [name] 03/03/2014, 8:46 AM

## 2014-03-03 NOTE — Progress Notes (Signed)
Infant found sleeping on the couch. Parents educated about safe sleep practices and fall prevention. Parents reminded to place infant in the crib when asleep. Parents stated that they were watching infant on the couch and that she didn't sleep well while in the crib.

## 2014-03-05 ENCOUNTER — Inpatient Hospital Stay (HOSPITAL_COMMUNITY): Admission: RE | Admit: 2014-03-05 | Payer: BC Managed Care – PPO | Source: Ambulatory Visit | Admitting: Obstetrics

## 2014-03-05 ENCOUNTER — Encounter (HOSPITAL_COMMUNITY): Admission: RE | Payer: Self-pay | Source: Ambulatory Visit

## 2014-03-05 SURGERY — Surgical Case
Anesthesia: Regional | Laterality: Bilateral

## 2014-03-05 MED ORDER — OXYCODONE-ACETAMINOPHEN 5-325 MG PO TABS: 1.0000 | ORAL_TABLET | ORAL | Status: DC | PRN

## 2014-03-05 MED ORDER — IBUPROFEN 600 MG PO TABS: 600.0000 mg | ORAL_TABLET | Freq: Four times a day (QID) | ORAL | Status: DC | PRN

## 2014-03-05 NOTE — Discharge Summary (Signed)
Obstetric Discharge Summary Reason for Admission: onset of labor Prenatal Procedures: ultrasound Intrapartum Procedures: cesarean: low cervical, transverse Postpartum Procedures: none Complications-Operative and Postpartum: none Hemoglobin  Date Value Ref Range Status  03/03/2014 8.4* 12.0 - 15.0 g/dL Final     HCT  Date Value Ref Range Status  03/03/2014 27.9* 36.0 - 46.0 % Final    Physical Exam:  General: alert and no distress Lochia: appropriate Uterine Fundus: firm Incision: healing well DVT Evaluation: No evidence of DVT seen on physical exam.  Discharge Diagnoses: Term Pregnancy-delivered  Discharge Information: Date: 03/05/2014 Activity: pelvic rest Diet: routine Medications: PNV, Ibuprofen, Colace, Iron and Percocet Condition: stable Instructions: refer to practice specific booklet Discharge to: home Follow-up Information   Follow up with HARPER,CHARLES A, MD In 2 weeks.   Specialty:  Obstetrics and Gynecology   Contact information:   [address] [place] [id number] [phone]       Newborn Data: Live born female  Birth Weight: 6 lb 11.2 oz (3040 g) APGAR: 8, 9  Home with mother.  Brock Badharles A Harper 03/05/2014, 9:29 AM

## 2014-03-05 NOTE — Progress Notes (Signed)
On assessment of wound vac dressing, noted some purulent drainage coming out of the bottom of the dressing, Dr Clearance CootsHarper had ordered for pt to go home with wound vac and come in office on Monday 03-09-14 for wound vac removal.  Was going to change wound vac dressing due to the drainage and upon removal purulent drainage was noted coming from skin breakdown that was pea sized with tunneling noted on the pubis.  No drainage noted from incision.  Called Dr Clearance CootsHarper who ordered not place another wound vac dressing and clean area and place steri-strips.  Informed pt and instructed her of care.

## 2014-03-05 NOTE — Progress Notes (Addendum)
Subjective: Postpartum Day 3: Cesarean Delivery Patient reports tolerating PO, + flatus, + BM and no problems voiding.    Objective: Vital signs in last 24 hours: Temp:  [97.8 F (36.6 C)-98 F (36.7 C)] 98 F (36.7 C) (04/09 0510) Pulse Rate:  [97-100] 97 (04/09 0510) Resp:  [20] 20 (04/09 0510) BP: (119-154)/(71-83) 119/71 mmHg (04/09 0510)  Physical Exam:  General: alert and no distress Lochia: appropriate Uterine Fundus: firm Incision: healing well DVT Evaluation: No evidence of DVT seen on physical exam.   Recent Labs  03/03/14 0553  HGB 8.4*  HCT 27.9*    Assessment/Plan: Status post Cesarean section. Doing well postoperatively.  Anemia.  Clinically stable.  Iron Rx. Discharge home with standard precautions and return to clinic in 2 weeks.  Brock Badharles A Delane Stalling 03/05/2014, 9:24 AM

## 2014-03-09 ENCOUNTER — Ambulatory Visit (INDEPENDENT_AMBULATORY_CARE_PROVIDER_SITE_OTHER): Payer: BC Managed Care – PPO | Admitting: Obstetrics

## 2014-03-09 ENCOUNTER — Encounter: Payer: Self-pay | Admitting: Obstetrics

## 2014-03-09 DIAGNOSIS — L02229 Furuncle of trunk, unspecified: Secondary | ICD-10-CM

## 2014-03-09 MED ORDER — AMOXICILLIN-POT CLAVULANATE 875-125 MG PO TABS: 1.0000 | ORAL_TABLET | Freq: Two times a day (BID) | ORAL | Status: DC

## 2014-03-09 NOTE — Addendum Note (Signed)
Addended by: Coral CeoHARPER, Claudell Wohler A on: 03/09/2014 04:41 PM   Modules accepted: Orders

## 2014-03-09 NOTE — Progress Notes (Signed)
Subjective:     Tabitha HellerShakia L Obrien is a 28 y.o. female who presents for a postpartum visit. She is 1 week postpartum following a low cervical transverse Cesarean section. I have fully reviewed the prenatal and intrapartum course. The delivery was at 39 gestational weeks. Outcome: primary cesarean section, low transverse incision. Anesthesia: spinal. Postpartum course has been WNL. Baby's course has been WNL. Baby is feeding by bottle - Daron OfferGerber Goodstart . Bleeding thin lochia. Bowel function is normal. Bladder function is normal. Patient is not sexually active. Contraception method is abstinence. Postpartum depression screening: negative.  The following portions of the patient's history were reviewed and updated as appropriate: allergies, current medications, past family history, past medical history, past social history, past surgical history and problem list.  Review of Systems Gastrointestinal: positive for abdominal pain and incisional pain   Objective:    LMP 05/31/2013  Breastfeeding? No  General:    alert and no distress Abdomen:  Incision C, D, I.  Nontender.                                      Assessment:     Normal postpartum exam. Pap smear not done at today's visit.   Plan:    1. Contraception: abstinence 2. Continue PNV's. 3. Follow up in: 2 weeks or as needed.

## 2014-03-24 ENCOUNTER — Ambulatory Visit (INDEPENDENT_AMBULATORY_CARE_PROVIDER_SITE_OTHER): Payer: BC Managed Care – PPO | Admitting: Obstetrics

## 2014-03-24 ENCOUNTER — Encounter: Payer: Self-pay | Admitting: Obstetrics

## 2014-03-24 DIAGNOSIS — Z3009 Encounter for other general counseling and advice on contraception: Secondary | ICD-10-CM

## 2014-03-25 ENCOUNTER — Encounter: Payer: Self-pay | Admitting: Obstetrics

## 2014-03-25 NOTE — Progress Notes (Signed)
Subjective:     [name] is a 28 y.o. female who presents for a postpartum visit. She is 2 weeks postpartum following a low cervical transverse Cesarean section and BTL.  I have fully reviewed the prenatal and intrapartum course. The delivery was at 39 gestational weeks. Outcome: primary cesarean section, low transverse incision. Anesthesia: spinal. Postpartum course has been normal. Baby's course has been normal. Baby is feeding by breast. Bleeding thin lochia. Bowel function is normal. Bladder function is normal. Patient is not sexually active. Contraception method is abstinence. Postpartum depression screening: negative.  The following portions of the patient's history were reviewed and updated as appropriate: allergies, current medications, past family history, past medical history, past social history, past surgical history and problem list.  Review of Systems A comprehensive review of systems was negative.   Objective:    BP 136/93  Pulse 89  Temp(Src) 97.9 F (36.6 C)  Ht 5' 5.5" (1.664 m)  Wt 274 lb (124.286 kg)  BMI 44.89 kg/m2  Breastfeeding? Yes  General:  alert and no distress   Breasts:  inspection negative, no nipple discharge or bleeding, no masses or nodularity palpable  Lungs: clear to auscultation bilaterally  Heart:  regular rate and rhythm, S1, S2 normal, no murmur, click, rub or gallop  Abdomen: normal findings: soft, non-tender   Vulva:  normal  Vagina: normal vagina  Cervix:  no lesions  Corpus: normal size, contour, position, consistency, mobility, non-tender  Adnexa:  no mass, fullness, tenderness  Rectal Exam: Not performed.         Assessment:     Normal postpartum exam. Pap smear not done at today's visit.   Plan:    1. Contraception: Options discussed. 2. Continue PNV's 3. Follow up in: 4 weeks or as needed.

## 2014-03-31 IMAGING — US US OB COMP LESS 14 WK
1 series · 14 of 25 positions shown · non-contrast
Comparison: None.

CLINICAL DATA: Viability, dating. Diabetes, obesity. By LMP, the
patient is 12 weeks 6 days. Absent fetal heart tones.

EXAM:
OBSTETRIC <14 WK ULTRASOUND
TECHNIQUE: Transabdominal ultrasound was performed for evaluation of the
gestation as well as the maternal uterus and adnexal regions.

[Series 1: us ob comp less 14 wks · 14 of 25 slices shown]
[im 1/25]
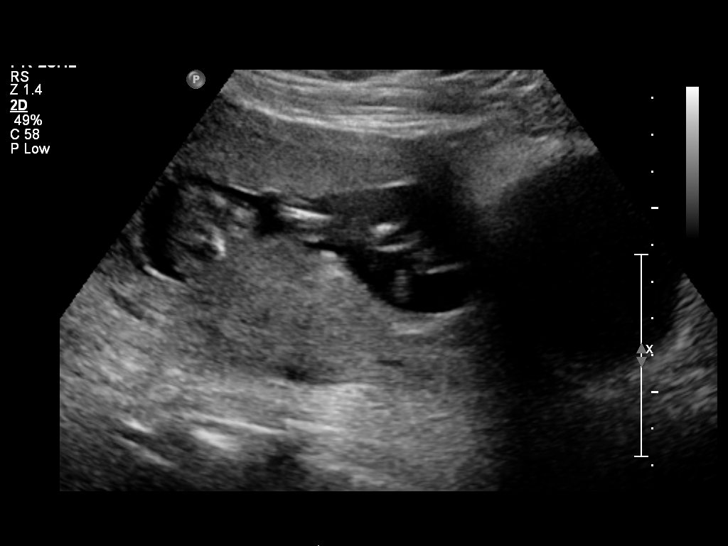
[im 3/25]
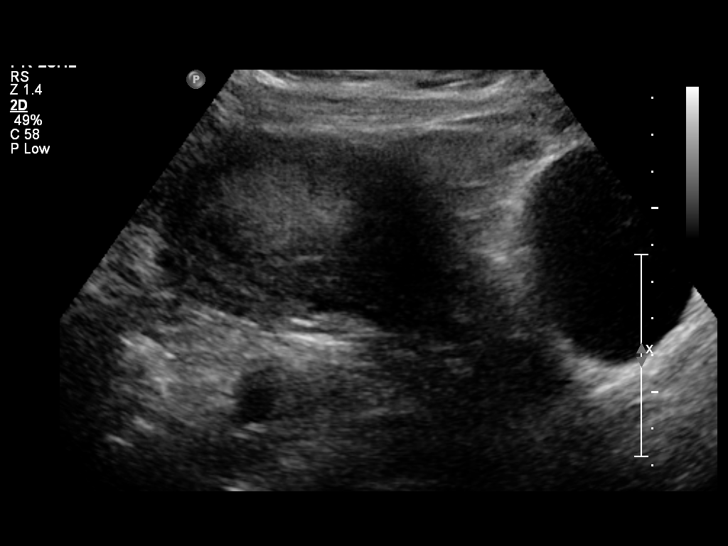
[im 5/25]
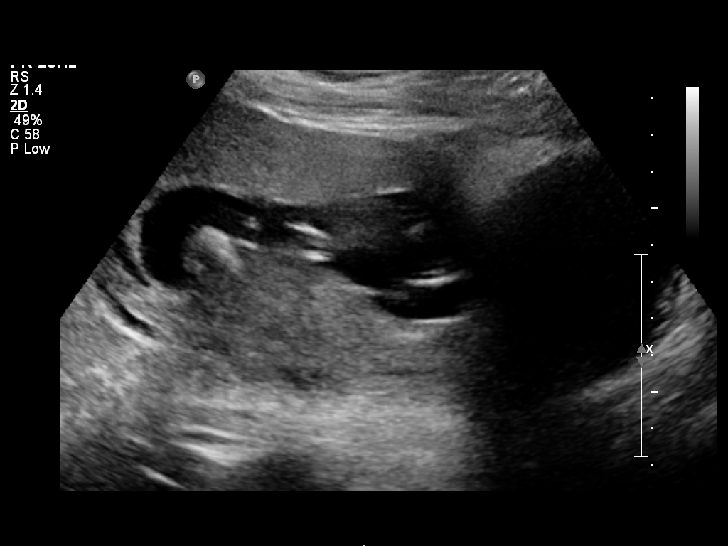
[im 7/25]
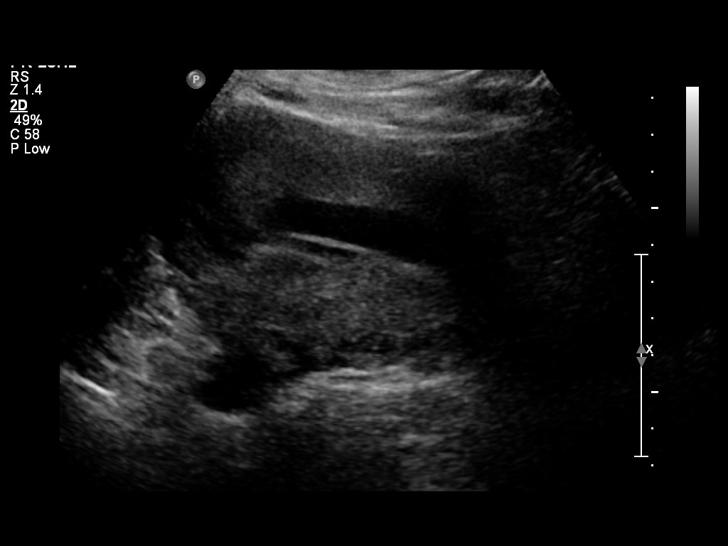
[im 9/25]
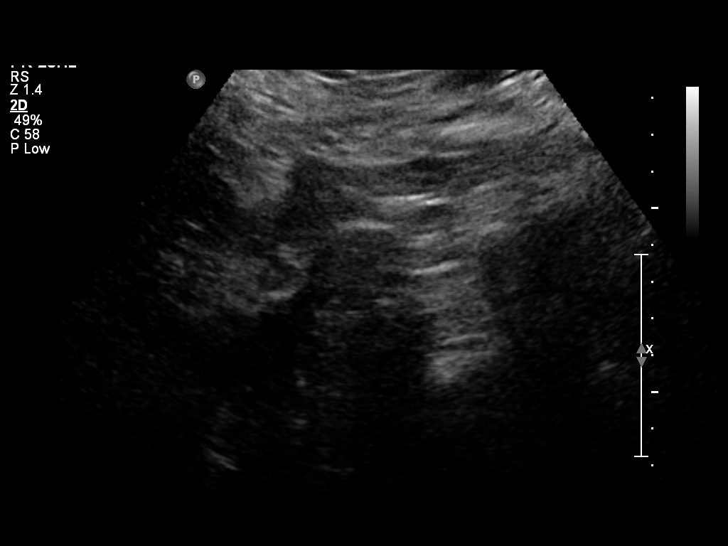
[im 10/25]
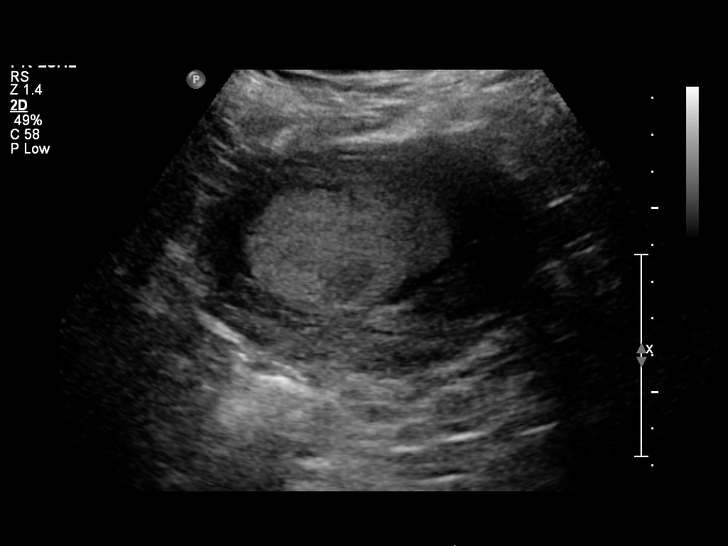
[im 12/25]
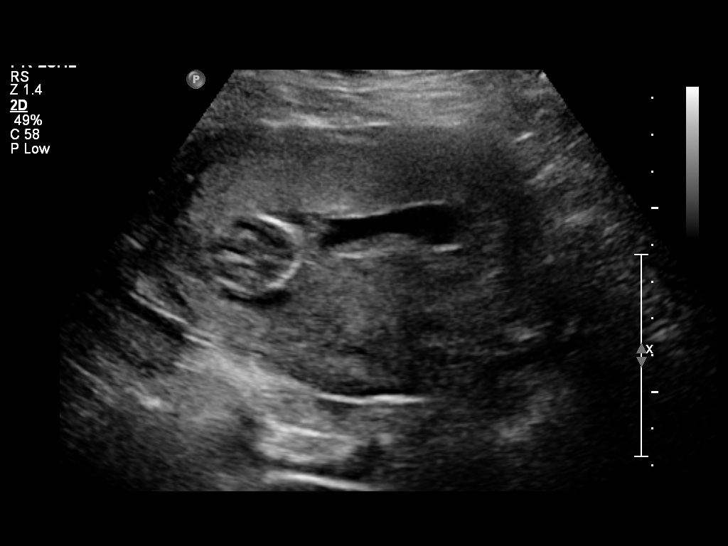
[im 14/25]
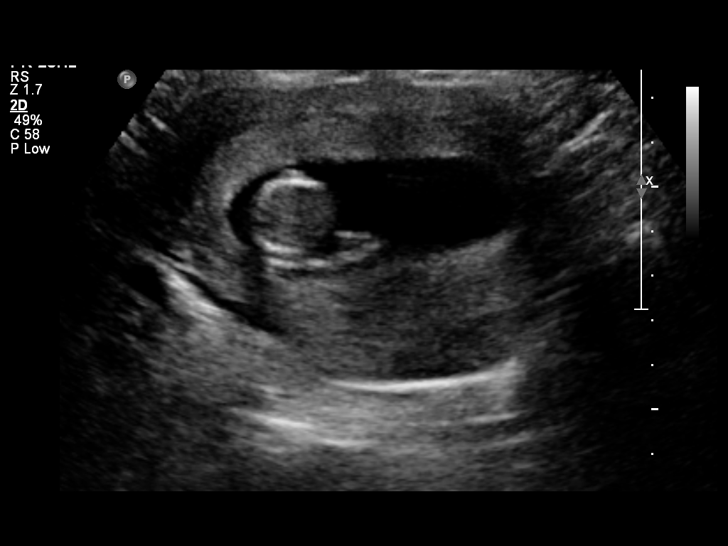
[im 16/25]
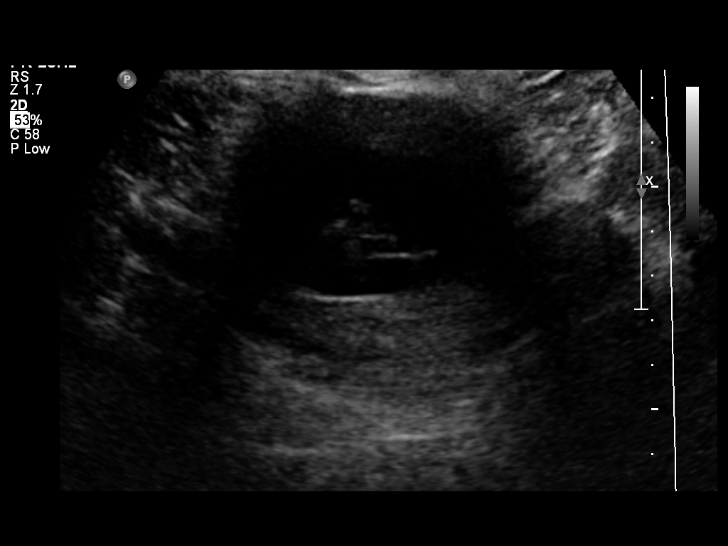
[im 17/25]
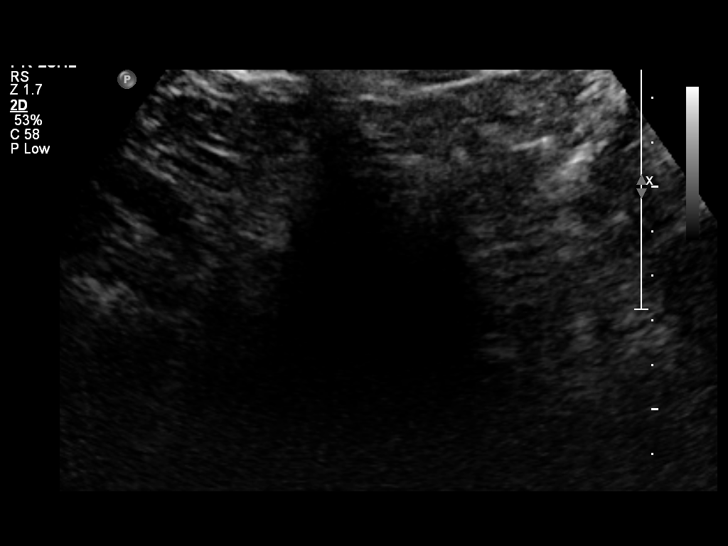
[im 19/25]
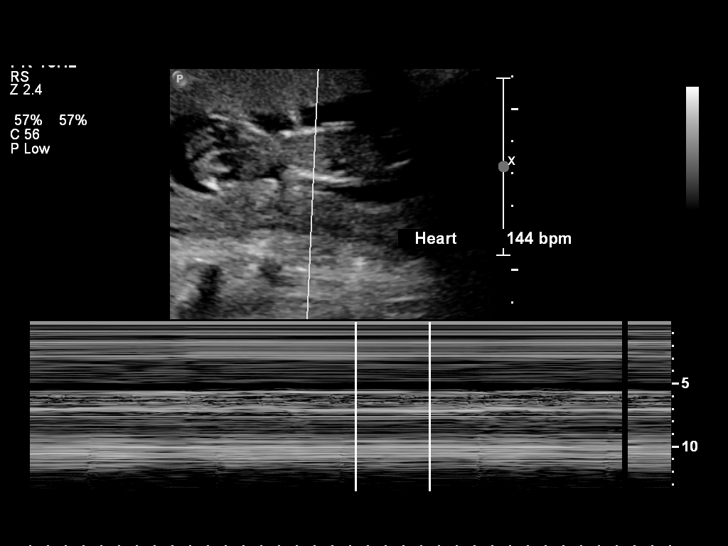
[im 21/25]
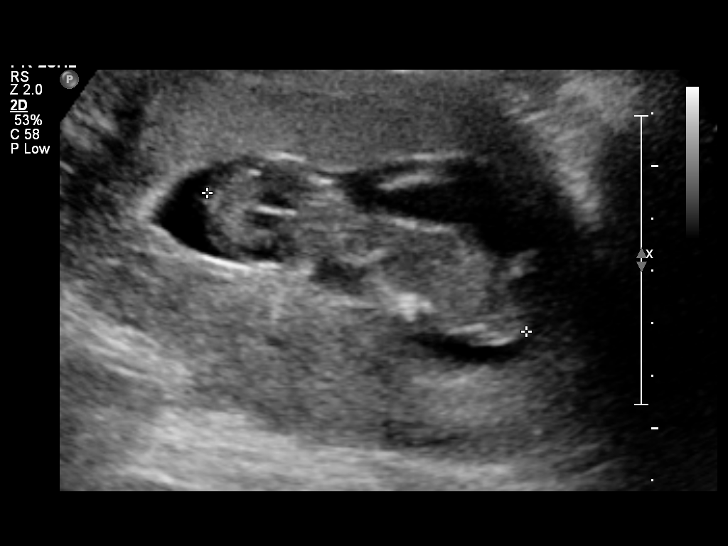
[im 23/25]
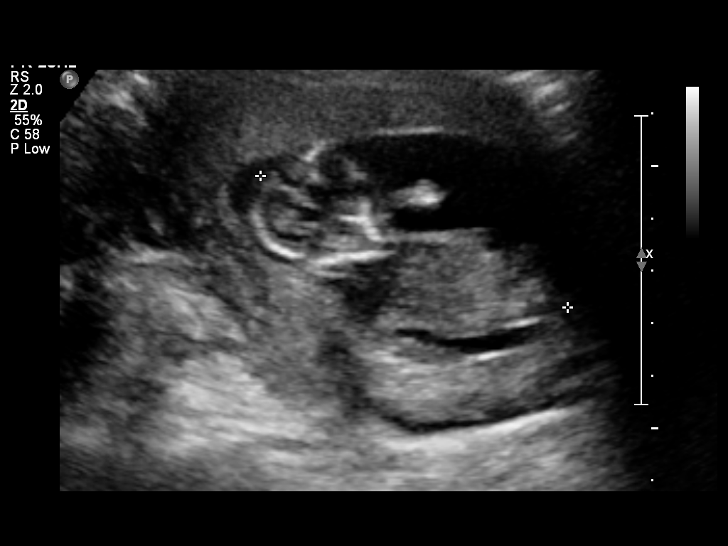
[im 25/25]
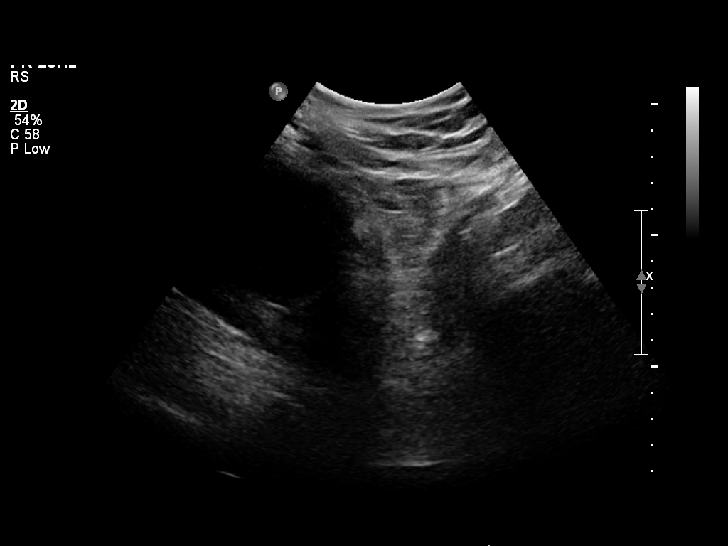

[14 of 25 positions shown; findings below may reference images not displayed]

FINDINGS: Intrauterine gestational sac: Visualized/normal in shape.

Yolk sac:  Not seen

Embryo:  Present

Cardiac Activity: Present

Heart Rate: 144 bpm

CRL:   64.4  mm   12 w 6 d                  US EDC: 03/07/2014

Maternal uterus/adnexae: No subchorionic hemorrhage identified. The
ovaries are not seen.
IMPRESSION: 1. Single living intrauterine fetus.
2. Size and dates correlate well.
3. Nonvisualized ovaries.

## 2014-05-05 ENCOUNTER — Ambulatory Visit: Payer: Medicaid Other | Admitting: Obstetrics

## 2014-05-12 IMAGING — US US OB DETAIL+14 WK
1 series · 12 of 28 positions shown · non-contrast
Comparison: none

[Series 1: us ob detail+14 wk · 0.23mm/px · 100 acquisitions, 12 frames shown]
[im 4/100]
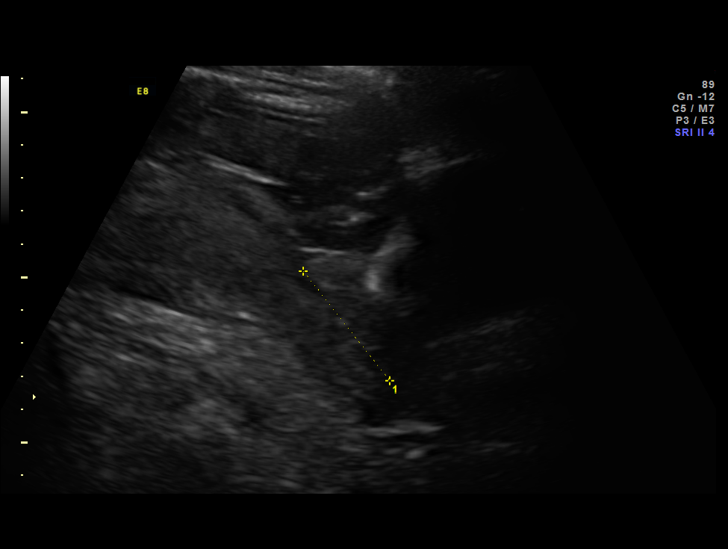
[im 12/100]
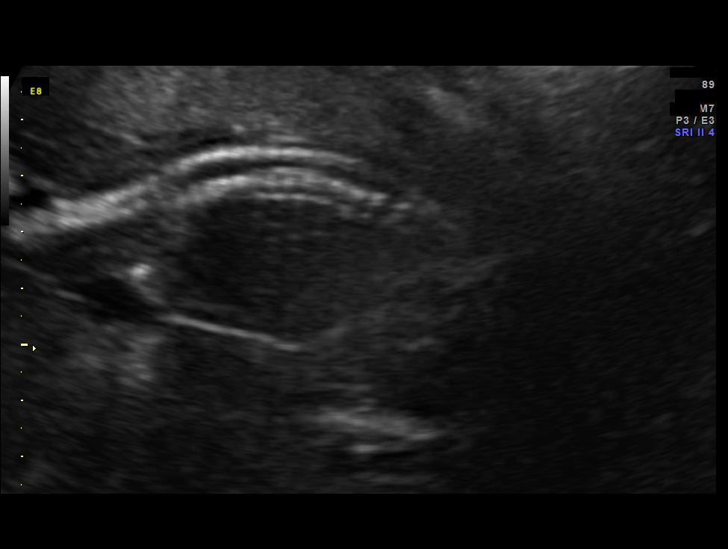
[im 19/100]
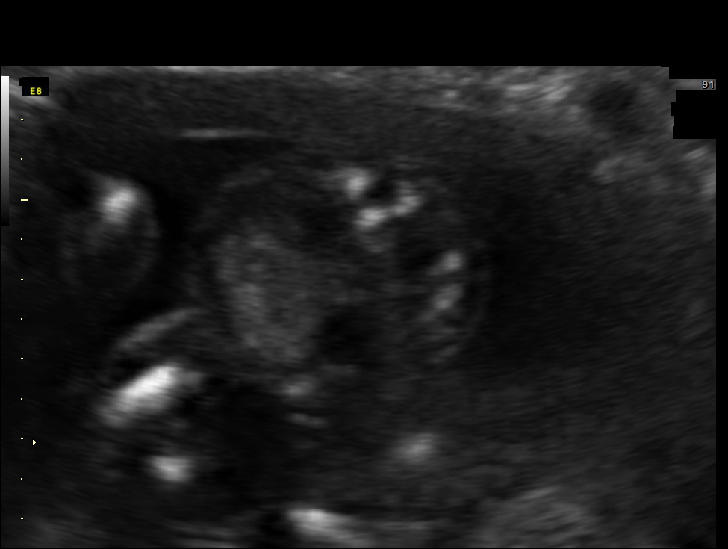
[im 30/100]
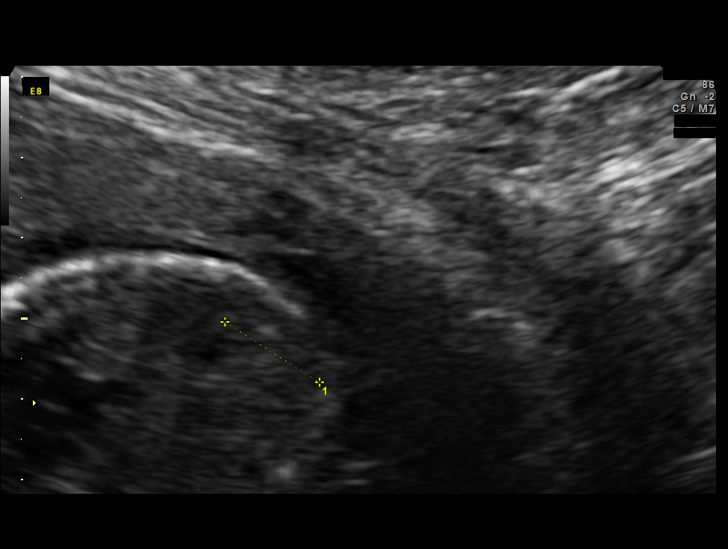
[im 37/100]
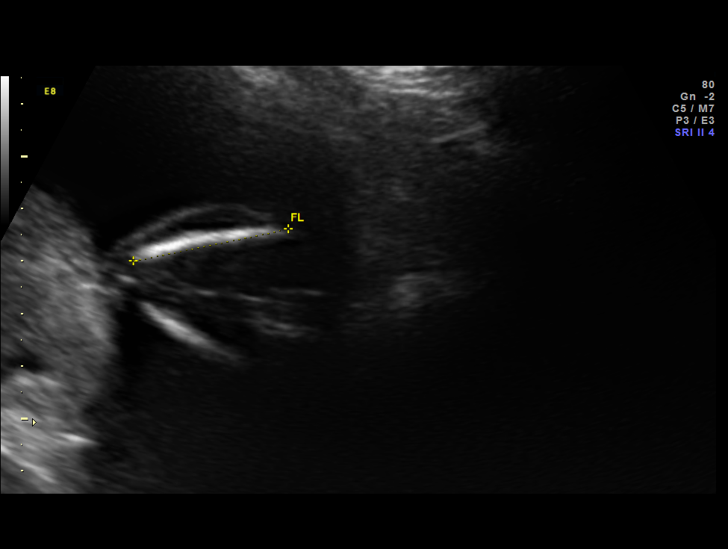
[im 45/100]
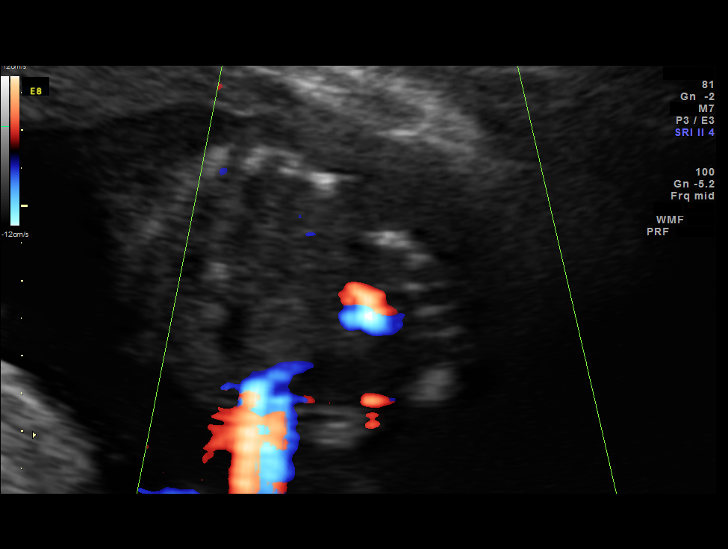
[im 56/100]
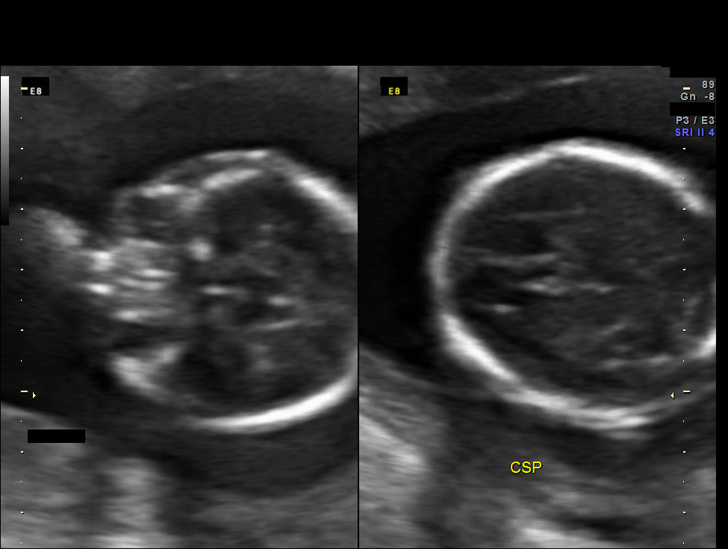
[im 63/100]
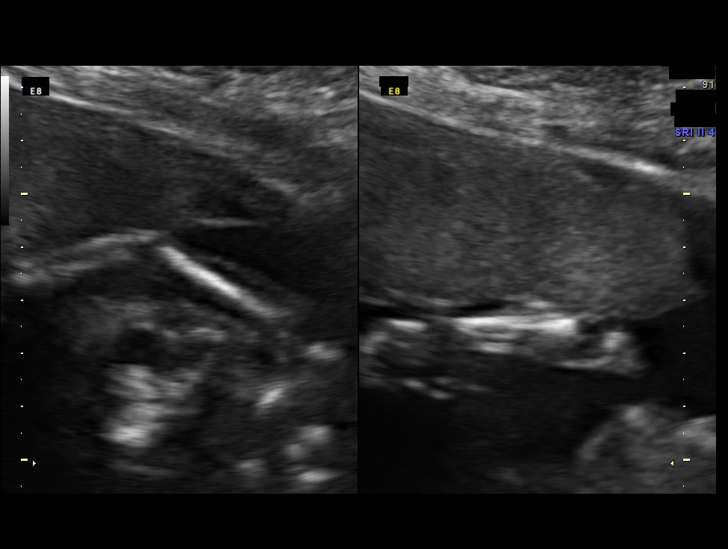
[im 70/100]
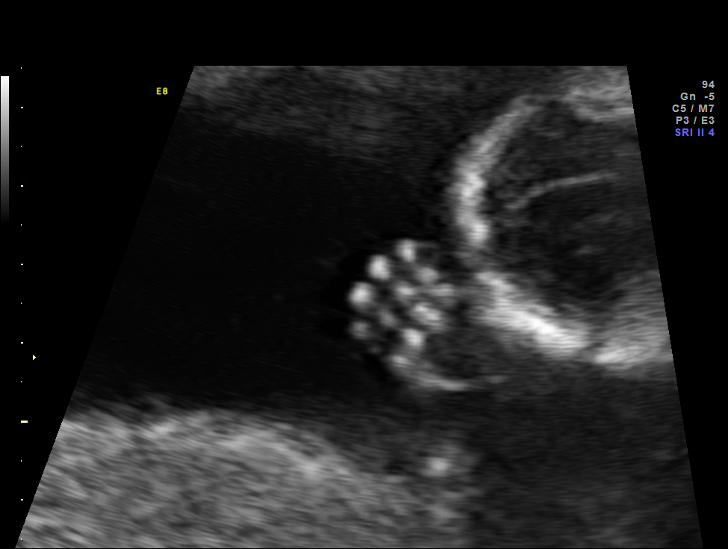
[im 81/100]
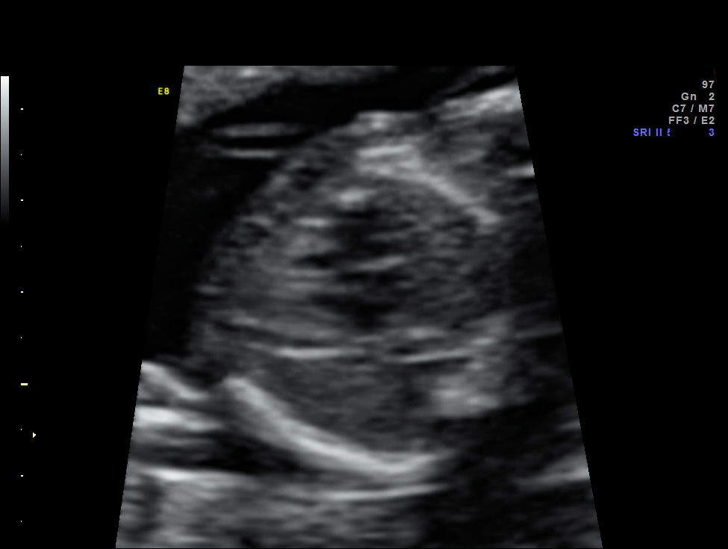
[im 89/100]
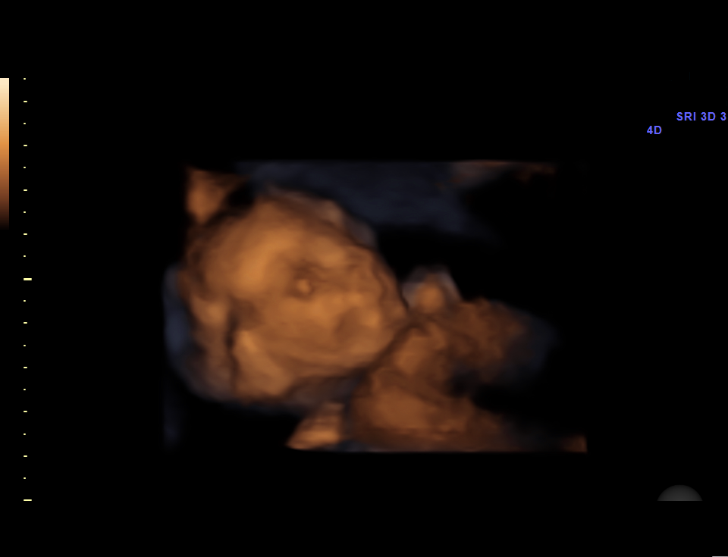
[im 96/100]
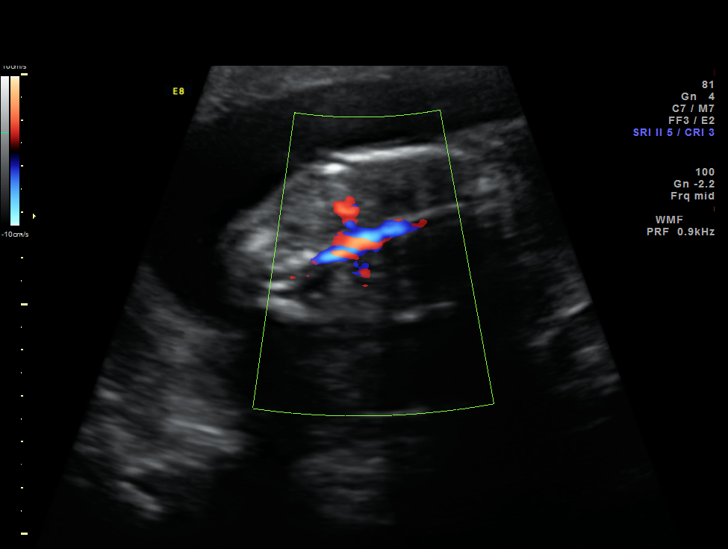

[12 of 28 positions shown; findings below may reference images not displayed]

OBSTETRICS REPORT
                      (Signed Final 10/10/2013 [DATE])

Service(s) Provided

 US OB DETAIL + 14 WK                                  76811.0
Indications

 Detailed fetal anatomic survey
 Abnormal biochemical screen (quad) for Trisomy
 21
 Previous cesarean section
 Maternal morbid obesity (286 lbs)
Fetal Evaluation

 Num Of Fetuses:    1
 Fetal Heart Rate:  136                          bpm
 Cardiac Activity:  Observed
 Presentation:      Variable
 Placenta:          Anterior, above cervical os
 P. Cord            Visualized, central
 Insertion:
Biometry

 BPD:       42  mm     G. Age:  18w 5d                CI:         84.0   70 - 86
 OFD:       50  mm                                    FL/HC:      19.4   16.1 -

 HC:     148.3  mm     G. Age:  18w 0d        9  %    HC/AC:      1.09   1.09 -

 AC:       136  mm     G. Age:  19w 0d       52  %    FL/BPD:
 FL:      28.7  mm     G. Age:  18w 6d       42  %    FL/AC:      21.1   20 - 24
 HUM:     28.2  mm     G. Age:  19w 1d       57  %
 CER:     17.3  mm     G. Age:  17w 2d       12  %

 Est. FW:     260  gm      0 lb 9 oz     44  %
Gestational Age

 LMP:           18w 6d        Date:  05/31/13                 EDD:   03/07/14
 U/S Today:     18w 4d                                        EDD:   03/09/14
 Best:          18w 6d     Det. By:  LMP  (05/31/13)          EDD:   03/07/14
Anatomy

 Cranium:          Appears normal         Aortic Arch:      Appears normal
 Fetal Cavum:      Appears normal         Ductal Arch:      Appears normal
 Ventricles:       Appears normal         Diaphragm:        Appears normal
 Choroid Plexus:   Appears normal         Stomach:          Appears normal, left
                                                            sided
 Cerebellum:       Appears normal         Abdomen:          Appears normal
 Posterior Fossa:  Appears normal         Abdominal Wall:   Appears nml (cord
                                                            insert, abd wall)
 Nuchal Fold:      Appears normal         Cord Vessels:     Appears normal (3
                                                            vessel cord)
 Face:             Appears normal         Kidneys:          Appear normal
                   (orbits and profile)
 Lips:             Appears normal         Bladder:          Appears normal
 Heart:            Appears normal         Spine:            Appears normal
                   (4CH, axis, and
                   situs)
 RVOT:             Appears normal         Lower             Appears normal
                                          Extremities:
 LVOT:             Appears normal         Upper             Appears normal
                                          Extremities:

 Other:  Fetus appears to be a female. Heels and 5th digit visualized. Nasal
         bone visualized. Technically difficult due to  maternal habitus.
Cervix Uterus Adnexa

 Cervical Length:    3.3      cm

 Cervix:       Normal appearance by transabdominal scan.
Impression

 IUP at 18+6 weeks
 Normal detailed fetal anatomy
 Markers of aneuploidy: none
 Normal amniotic fluid volume
 Measurements consistent with LMP dating

 The US findings were shared with Ms. Bambucafe. The
 implications of a normal detailed US on her T21 risk were
 discussed in detail. All of her prenatal testing options were
 reviewed. After careful consideration, she declined
 amniocentesis and all aneuploidy screening and diagnostic
 testing.
Recommendations

 Follow-up as clinically indicated
 Please see genetic counseling note

## 2014-09-28 ENCOUNTER — Encounter: Payer: Self-pay | Admitting: Obstetrics

## 2014-11-23 ENCOUNTER — Encounter: Payer: Self-pay | Admitting: *Deleted

## 2014-11-24 ENCOUNTER — Encounter: Payer: Self-pay | Admitting: Obstetrics & Gynecology

## 2015-05-12 ENCOUNTER — Emergency Department (HOSPITAL_COMMUNITY): Payer: BLUE CROSS/BLUE SHIELD

## 2015-05-12 ENCOUNTER — Emergency Department (HOSPITAL_COMMUNITY)
Admission: EM | Admit: 2015-05-12 | Discharge: 2015-05-12 | Disposition: A | Discharge status: Home / Self Care | Payer: BLUE CROSS/BLUE SHIELD | Attending: Emergency Medicine | Admitting: Emergency Medicine

## 2015-05-12 ENCOUNTER — Encounter (HOSPITAL_COMMUNITY): Payer: Self-pay | Admitting: Emergency Medicine

## 2015-05-12 DIAGNOSIS — Z720 Tobacco use: Secondary | ICD-10-CM | POA: Insufficient documentation

## 2015-05-12 DIAGNOSIS — K420 Umbilical hernia with obstruction, without gangrene: Secondary | ICD-10-CM | POA: Insufficient documentation

## 2015-05-12 DIAGNOSIS — Z79899 Other long term (current) drug therapy: Secondary | ICD-10-CM | POA: Diagnosis not present

## 2015-05-12 DIAGNOSIS — Z792 Long term (current) use of antibiotics: Secondary | ICD-10-CM | POA: Diagnosis not present

## 2015-05-12 DIAGNOSIS — N760 Acute vaginitis: Secondary | ICD-10-CM | POA: Insufficient documentation

## 2015-05-12 DIAGNOSIS — Z3202 Encounter for pregnancy test, result negative: Secondary | ICD-10-CM | POA: Diagnosis not present

## 2015-05-12 DIAGNOSIS — R109 Unspecified abdominal pain: Secondary | ICD-10-CM

## 2015-05-12 DIAGNOSIS — R110 Nausea: Secondary | ICD-10-CM | POA: Insufficient documentation

## 2015-05-12 DIAGNOSIS — R1030 Lower abdominal pain, unspecified: Secondary | ICD-10-CM | POA: Diagnosis present

## 2015-05-12 DIAGNOSIS — B9689 Other specified bacterial agents as the cause of diseases classified elsewhere: Secondary | ICD-10-CM

## 2015-05-12 DIAGNOSIS — R3915 Urgency of urination: Secondary | ICD-10-CM | POA: Diagnosis not present

## 2015-05-12 DIAGNOSIS — Z9851 Tubal ligation status: Secondary | ICD-10-CM | POA: Diagnosis not present

## 2015-05-12 DIAGNOSIS — R102 Pelvic and perineal pain: Secondary | ICD-10-CM

## 2015-05-12 DIAGNOSIS — K429 Umbilical hernia without obstruction or gangrene: Secondary | ICD-10-CM

## 2015-05-12 LAB — CBC WITH DIFFERENTIAL/PLATELET
BASOS ABS: 0.1 10*3/uL (ref 0.0–0.1)
Basophils Relative: 1 % (ref 0–1)
Eosinophils Absolute: 0.2 10*3/uL (ref 0.0–0.7)
Eosinophils Relative: 4 % (ref 0–5)
HEMATOCRIT: 33.3 % — AB (ref 36.0–46.0)
HEMOGLOBIN: 10.1 g/dL — AB (ref 12.0–15.0)
LYMPHS ABS: 2.6 10*3/uL (ref 0.7–4.0)
Lymphocytes Relative: 42 % (ref 12–46)
MCH: 21.7 pg — ABNORMAL LOW (ref 26.0–34.0)
MCHC: 30.3 g/dL (ref 30.0–36.0)
MCV: 71.6 fL — ABNORMAL LOW (ref 78.0–100.0)
MONO ABS: 0.4 10*3/uL (ref 0.1–1.0)
Monocytes Relative: 7 % (ref 3–12)
Neutro Abs: 2.9 10*3/uL (ref 1.7–7.7)
Neutrophils Relative %: 46 % (ref 43–77)
PLATELETS: 380 10*3/uL (ref 150–400)
RBC: 4.65 MIL/uL (ref 3.87–5.11)
RDW: 16.6 % — ABNORMAL HIGH (ref 11.5–15.5)
WBC: 6.2 10*3/uL (ref 4.0–10.5)

## 2015-05-12 LAB — URINALYSIS, ROUTINE W REFLEX MICROSCOPIC
Bilirubin Urine: NEGATIVE
GLUCOSE, UA: NEGATIVE mg/dL
Hgb urine dipstick: NEGATIVE
Ketones, ur: 15 mg/dL — AB
LEUKOCYTES UA: NEGATIVE
Nitrite: NEGATIVE
PROTEIN: NEGATIVE mg/dL
Specific Gravity, Urine: 1.033 — ABNORMAL HIGH (ref 1.005–1.030)
UROBILINOGEN UA: 1 mg/dL (ref 0.0–1.0)
pH: 7 (ref 5.0–8.0)

## 2015-05-12 LAB — WET PREP, GENITAL
Trich, Wet Prep: NONE SEEN
YEAST WET PREP: NONE SEEN

## 2015-05-12 LAB — COMPREHENSIVE METABOLIC PANEL
ALT: 12 U/L — ABNORMAL LOW (ref 14–54)
AST: 16 U/L (ref 15–41)
Albumin: 3.3 g/dL — ABNORMAL LOW (ref 3.5–5.0)
Alkaline Phosphatase: 67 U/L (ref 38–126)
Anion gap: 6 (ref 5–15)
BILIRUBIN TOTAL: 0.2 mg/dL — AB (ref 0.3–1.2)
BUN: 7 mg/dL (ref 6–20)
CALCIUM: 9 mg/dL (ref 8.9–10.3)
CHLORIDE: 109 mmol/L (ref 101–111)
CO2: 25 mmol/L (ref 22–32)
CREATININE: 0.77 mg/dL (ref 0.44–1.00)
GLUCOSE: 108 mg/dL — AB (ref 65–99)
Potassium: 3.8 mmol/L (ref 3.5–5.1)
Sodium: 140 mmol/L (ref 135–145)
Total Protein: 6.5 g/dL (ref 6.5–8.1)

## 2015-05-12 LAB — LIPASE, BLOOD: Lipase: 21 U/L — ABNORMAL LOW (ref 22–51)

## 2015-05-12 LAB — POC URINE PREG, ED: Preg Test, Ur: NEGATIVE

## 2015-05-12 MED ORDER — IOHEXOL 300 MG/ML  SOLN
100.0000 mL | Freq: Once | INTRAMUSCULAR | Status: AC | PRN
  Administered 2015-05-12: 100 mL via INTRAVENOUS

## 2015-05-12 MED ORDER — AZITHROMYCIN 250 MG PO TABS
1000.0000 mg | ORAL_TABLET | Freq: Once | ORAL | Status: AC
  Administered 2015-05-12: 1000 mg via ORAL
  Filled 2015-05-12: qty 4

## 2015-05-12 MED ORDER — METRONIDAZOLE 500 MG PO TABS: 500.0000 mg | ORAL_TABLET | Freq: Two times a day (BID) | ORAL | Status: DC

## 2015-05-12 MED ORDER — CEFTRIAXONE SODIUM 250 MG IJ SOLR
250.0000 mg | Freq: Once | INTRAMUSCULAR | Status: AC
  Administered 2015-05-12: 250 mg via INTRAMUSCULAR
  Filled 2015-05-12: qty 250

## 2015-05-12 MED ORDER — IOHEXOL 300 MG/ML  SOLN
25.0000 mL | Freq: Once | INTRAMUSCULAR | Status: AC | PRN
  Administered 2015-05-12: 25 mL via ORAL

## 2015-05-12 MED ORDER — LIDOCAINE HCL (PF) 1 % IJ SOLN
0.9000 mL | Freq: Once | INTRAMUSCULAR | Status: AC
  Administered 2015-05-12: 0.9 mL
  Filled 2015-05-12: qty 5

## 2015-05-12 MED ORDER — SODIUM CHLORIDE 0.9 % IV BOLUS (SEPSIS)
1000.0000 mL | Freq: Once | INTRAVENOUS | Status: AC
  Administered 2015-05-12: 1000 mL via INTRAVENOUS

## 2015-05-12 NOTE — Discharge Instructions (Signed)
Please call your doctor for a followup appointment within 24-48 hours. When you talk to your doctor please let them know that you were seen in the emergency department and have them acquire all of your records so that they can discuss the findings with you and formulate a treatment plan to fully care for your new and ongoing problems. Please follow-up with health and wellness Center Please follow-up with OB/GYN Please rest and stay hydrated Please continue to monitor symptoms closely and if symptoms are to worsen or change (fever greater than 101, chills, sweating, nausea, vomiting, chest pain, shortness of breathe, difficulty breathing, weakness, numbness, tingling, worsening or changes to pain pattern, blood in stool, black tarry stools, inability keep food or fluids down, back pain, decreased urination, pain with urination, blood in the urine, vaginal discharge, vaginal bleeding) please report back to the Emergency Department immediately.   Bacterial Vaginosis Bacterial vaginosis is a vaginal infection that occurs when the normal balance of bacteria in the vagina is disrupted. It results from an overgrowth of certain bacteria. This is the most common vaginal infection in women of childbearing age. Treatment is important to prevent complications, especially in pregnant women, as it can cause a premature delivery. CAUSES  Bacterial vaginosis is caused by an increase in harmful bacteria that are normally present in smaller amounts in the vagina. Several different kinds of bacteria can cause bacterial vaginosis. However, the reason that the condition develops is not fully understood. RISK FACTORS Certain activities or behaviors can put you at an increased risk of developing bacterial vaginosis, including:  Having a new sex partner or multiple sex partners.  Douching.  Using an intrauterine device (IUD) for contraception. Women do not get bacterial vaginosis from toilet seats, bedding, swimming  pools, or contact with objects around them. SIGNS AND SYMPTOMS  Some women with bacterial vaginosis have no signs or symptoms. Common symptoms include:  Grey vaginal discharge.  A fishlike odor with discharge, especially after sexual intercourse.  Itching or burning of the vagina and vulva.  Burning or pain with urination. DIAGNOSIS  Your health care provider will take a medical history and examine the vagina for signs of bacterial vaginosis. A sample of vaginal fluid may be taken. Your health care provider will look at this sample under a microscope to check for bacteria and abnormal cells. A vaginal pH test may also be done.  TREATMENT  Bacterial vaginosis may be treated with antibiotic medicines. These may be given in the form of a pill or a vaginal cream. A second round of antibiotics may be prescribed if the condition comes back after treatment.  HOME CARE INSTRUCTIONS   Only take over-the-counter or prescription medicines as directed by your health care provider.  If antibiotic medicine was prescribed, take it as directed. Make sure you finish it even if you start to feel better.  Do not have sex until treatment is completed.  Tell all sexual partners that you have a vaginal infection. They should see their health care provider and be treated if they have problems, such as a mild rash or itching.  Practice safe sex by using condoms and only having one sex partner. SEEK MEDICAL CARE IF:   Your symptoms are not improving after 3 days of treatment.  You have increased discharge or pain.  You have a fever. MAKE SURE YOU:   Understand these instructions.  Will watch your condition.  Will get help right away if you are not doing well or get worse.  FOR MORE INFORMATION  Centers for Disease Control and Prevention, Division of STD Prevention: SolutionApps.co.za American Sexual Health Association (ASHA): www.ashastd.org  Document Released: 11/13/2005 Document Revised: 09/03/2013  Document Reviewed: 06/25/2013 [facility] Patient Information 2015 [place], [place]. This information is not intended to replace advice given to you by your health care provider. Make sure you discuss any questions you have with your health care provider.   Abdominal Pain Many things can cause abdominal pain. Usually, abdominal pain is not caused by a disease and will improve without treatment. It can often be observed and treated at home. Your health care provider will do a physical exam and possibly order blood tests and X-rays to help determine the seriousness of your pain. However, in many cases, more time must pass before a clear cause of the pain can be found. Before that point, your health care provider may not know if you need more testing or further treatment. HOME CARE INSTRUCTIONS  Monitor your abdominal pain for any changes. The following actions may help to alleviate any discomfort you are experiencing:  Only take over-the-counter or prescription medicines as directed by your health care provider.  Do not take laxatives unless directed to do so by your health care provider.  Try a clear liquid diet (broth, tea, or water) as directed by your health care provider. Slowly move to a bland diet as tolerated. SEEK MEDICAL CARE IF:  You have unexplained abdominal pain.  You have abdominal pain associated with nausea or diarrhea.  You have pain when you urinate or have a bowel movement.  You experience abdominal pain that wakes you in the night.  You have abdominal pain that is worsened or improved by eating food.  You have abdominal pain that is worsened with eating fatty foods.  You have a fever. SEEK IMMEDIATE MEDICAL CARE IF:   Your pain does not go away within 2 hours.  You keep throwing up (vomiting).  Your pain is felt only in portions of the abdomen, such as the right side or the left lower portion of the abdomen.  You pass bloody or black tarry stools. MAKE SURE  YOU:  Understand these instructions.   Will watch your condition.   Will get help right away if you are not doing well or get worse.  Document Released: 08/23/2005 Document Revised: 11/18/2013 Document Reviewed: 07/23/2013 [facility] Patient Information 2015 [place], [place]. This information is not intended to replace advice given to you by your health care provider. Make sure you discuss any questions you have with your health care provider.   Hernia A hernia occurs when an internal organ pushes out through a weak spot in the abdominal wall. Hernias most commonly occur in the groin and around the navel. Hernias often can be pushed back into place (reduced). Most hernias tend to get worse over time. Some abdominal hernias can get stuck in the opening (irreducible or incarcerated hernia) and cannot be reduced. An irreducible abdominal hernia which is tightly squeezed into the opening is at risk for impaired blood supply (strangulated hernia). A strangulated hernia is a medical emergency. Because of the risk for an irreducible or strangulated hernia, surgery may be recommended to repair a hernia. CAUSES   Heavy lifting.  Prolonged coughing.  Straining to have a bowel movement.  A cut (incision) made during an abdominal surgery. HOME CARE INSTRUCTIONS   Bed rest is not required. You may continue your normal activities.  Avoid lifting more than 10 pounds (4.5 kg) or straining.  Cough gently. If you are a smoker it is best to stop. Even the best hernia repair can break down with the continual strain of coughing. Even if you do not have your hernia repaired, a cough will continue to aggravate the problem.  Do not wear anything tight over your hernia. Do not try to keep it in with an outside bandage or truss. These can damage abdominal contents if they are trapped within the hernia sac.  Eat a normal diet.  Avoid constipation. Straining over long periods of time will increase hernia size  and encourage breakdown of repairs. If you cannot do this with diet alone, stool softeners may be used. SEEK IMMEDIATE MEDICAL CARE IF:   You have a fever.  You develop increasing abdominal pain.  You feel nauseous or vomit.  Your hernia is stuck outside the abdomen, looks discolored, feels hard, or is tender.  You have any changes in your bowel habits or in the hernia that are unusual for you.  You have increased pain or swelling around the hernia.  You cannot push the hernia back in place by applying gentle pressure while lying down. MAKE SURE YOU:   Understand these instructions.  Will watch your condition.  Will get help right away if you are not doing well or get worse. Document Released: 11/13/2005 Document Revised: 02/05/2012 Document Reviewed: 07/02/2008 [facility] Patient Information 2015 [place], [place]. This information is not intended to replace advice given to you by your health care provider. Make sure you discuss any questions you have with your health care provider.    Emergency Department Resource Guide 1) Find a Doctor and Pay Out of Pocket Although you won't have to find out who is covered by your insurance plan, it is a good idea to ask around and get recommendations. You will then need to call the office and see if the doctor you have chosen will accept you as a new patient and what types of options they offer for patients who are self-pay. Some doctors offer discounts or will set up payment plans for their patients who do not have insurance, but you will need to ask so you aren't surprised when you get to your appointment.  2) Contact Your Local Health Department Not all health departments have doctors that can see patients for sick visits, but many do, so it is worth a call to see if yours does. If you don't know where your local health department is, you can check in your phone book. The CDC also has a tool to help you locate your state's health department, and  many state websites also have listings of all of their local health departments.  3) Find a Walk-in Clinic If your illness is not likely to be very severe or complicated, you may want to try a walk in clinic. These are popping up all over the country in pharmacies, drugstores, and shopping centers. They're usually staffed by nurse practitioners or physician assistants that have been trained to treat common illnesses and complaints. They're usually fairly quick and inexpensive. However, if you have serious medical issues or chronic medical problems, these are probably not your best option.  No Primary Care Doctor: - Call Health Connect at  [phone] - they can help you locate a primary care doctor that  accepts your insurance, provides certain services, etc. - Physician Referral Service- [phone]  Chronic Pain Problems: Organization         Address  Phone   Notes  [facility]  [phone] Patients need to be referred by their primary care doctor.   Medication Assistance: Organization         Address  Phone   Notes  [facility] [address] [place], [place] [address] [phone] --Must be a resident of [facility] -- Must have NO insurance coverage whatsoever (no Medicaid/ Medicare, etc.) -- The pt. MUST have a primary care doctor that directs their care regularly and follows them in the community   [facility]ist  [phone]   [employer]  [phone]    Agencies that provide inexpensive medical care: Organization         Address  Phone   Notes  [place]  [phone]   [facility]    [phone]   [facility] [place], [place] [address] [phone]   [place] [address], [place] [phone]   Planned Parenthood    [phone]   [facility]    [phone]   [place]  and [place] [address], [place] Phone:  [phone], Fax:  [phone] Hours of Operation:  9 am - 6 pm, M-F.  Also accepts Medicaid/Medicare and self-pay.  [facility] for [place], [address], [place] Phone: [phone], Fax: [phone]. Hours of Operation:  8:30 am - 5:30 pm, M-F.  Also accepts Medicaid and self-pay.  [facility] [address] [place] Phone: [phone]   [place], [place], [place] [phone], Ext. [phone] Mondays & Thursdays: 7-9 AM.  First 15 patients are seen on a first come, first serve basis.    [place] Providers:  Organization         Address  Phone   Notes  [facility] [address], [place] [phone] Also accepts self-pay patients.  [facility] [address], [place]  [phone]   [place], [address], [place] [phone]   [facility] [address], [place] [phone]   Lucianne Lei [address], [place]   [phone] Only accepts [place] Access [place] patients after they have their name applied to their card.   Self-Pay (no insurance) in [facility]:  Organization         Address  Phone   Notes  Sickle Cell Patients, [facility] Internal [facility] [place] [phone]   [facility] [place] [phone]   [facility] [place]  [place], [place], [place] [phone]   [facility]/Dr. Osei-Bonsu  [address] [place] or [address], [place] [phone] Phone number for both [place] and [place] locations is the same.  Urgent Medical and [facility] [address], [place] [phone]   [place], [place] or [address] [phone]   [facility] [address], [place] [phone], phone; [phone], fax Sees patients 1st and 3rd Saturday of every month.  Must not qualify for public or private insurance (i.e. Medicaid, Medicare,  [place] Health Choice, Veterans' Benefits)  Household income should be no more than 200% of the poverty level The clinic cannot treat you if you are pregnant or think you are pregnant  Sexually transmitted diseases are not treated at the clinic.    Dental Care: Organization         Address  Phone  Notes  [facility] [place] [phone] Accepts children up to age 38 who are enrolled in [place] or [place]; pregnant women with a Medicaid card; and children who have applied for Medicaid or [place] Health Choice, but were declined, whose parents can pay a reduced fee at time of service.  [facility] Department of [facility]  [address], [place] [phone] Accepts children up to age 21 who are enrolled in [place] or [place]; pregnant women with a Medicaid card; and children who have applied for Medicaid or [place] Health Choice, but were declined, whose parents can pay a reduced fee at time of service.  [place] Adult Dental Access PROGRAM  [place] [phone] Patients are seen by appointment only. Walk-ins are not accepted. [place] will see patients 19 years of age and older. Monday - Tuesday (8am-5pm) Most Wednesdays (8:30-5pm) $30 per visit, cash only  [facility] Adult [facility] Access PROGRAM  [address], [facility] [phone] Patients are seen by appointment only. Walk-ins are not accepted. [place] will see patients 35 years of age and older. One Wednesday Evening (Monthly: Volunteer Based).  $30 per visit, cash only   [place]  [phone] for adults; Children under age 12, call Graduate Pediatric Dentistry at [phone]. Children aged 75-14, please call [phone] to request a pediatric application.  Dental services are provided in all areas of dental care including fillings, crowns and bridges, complete and partial dentures, implants, gum treatment, root canals, and extractions. Preventive care is also provided. Treatment is provided to both adults and children. Patients are selected via a lottery and there is often a waiting list.   [facility] [address], [place]  [phone] [email or web address]   [facility] [address] [place], [place] [phone], Ext. [phone] Second and Fourth Thursday of each month, opens at 6:30 AM; Clinic ends at 9 AM.  Patients are seen on a first-come first-served basis, and a limited number are seen during each clinic.   [facility]  [address] [place], [place] [phone]   Eligibility Requirements You must have lived in [place], [place], or [place] counties for at least the last three months.   You cannot be eligible for state or federal sponsored [employer], including [employer], [place], or [employer].   You generally cannot be eligible for healthcare insurance through your employer.    How to apply: Eligibility screenings are held every Tuesday and Wednesday afternoon from 1:00 pm until 4:00 pm. You do not need an appointment for the interview!  [facility] [address], [place], [place]   [place]  [place] Section Three Department  [place]  [phone]    Behavioral Health Resources in the Community: Intensive Outpatient Programs Organization         Address  Phone  Notes  [place] [phone]  [address], [place],  [place] [phone]   [facility]patient [address], [place], [place]   ADS: Alcohol & Drug S[facility] [address] [place], [place]   [place] [address]  [place], [place] or [phone]   Substance Abuse Resources Organization         Address  Phone  Notes  Alcohol and Drug Services  [phone]   [place]  [phone]   [facility] [place]   Chinita Pester  [phone]   Residential & Outpatient Substance Abuse Program  [phone]   Psychological Services Organization         Address  Phone  Notes  [facility]  [place]  [phone]   [place] [address], [place] or [phone]    Mobile Crisis Teams Organization         Address  Phone  Notes  [facility], Mobile Crisis Care Unit  [phone]   [facility] Psychotherapeutic Services  [address] [place], [place]   [place] Levels [address] [place] [phone]    Self-Help/Support Groups Organization         Address  Phone             Notes  [place]. of [place] - variety of support groups  [place] Call for more information  Narcotics Anonymous (NA), [facility] [address], [employer]   2 meetings at this location   Special educational needs teacher         Address  Phone  Notes  ASAP Residential Treatment [place],    [place]  [phone]   [facility]  [address], [place] [address], [place], [place]   [place], [place] [phone] Admissions: 8am-3pm M-F  Incentives Substance [place] [address],    [place], [place] [phone]   [facility] [address] [place], [place], [place]   [facility] [address],  [place], [place]   [facility] Programs - Intensive Outpatient [address]een [address], [place], [place]   [facility] ([place]  [place], [place] [phone] or [phone]   [facility] (RTS) [address], [place], [place] Accepts Medicaid  [facility] [place] [address]  [place] [phone] Substance Abuse/Addiction Treatment   [facility] Organization         Address  Phone  Notes  [facility]  [phone]   Domenic Schwab, PhD [address] [place], [place]   [phone] or [phone]   [place], [place] [phone]   [facility] [address] [place], [place] [phone] Insurance/Medicaid/sponsorship through [facility] and Families [address], [address] [place]                                    [place], [place] [phone] [place] [address], [place] [phone]    Dr. Adele Schilder  [phone]   [facility] Dept. 1) [address] [place] 2) [place] 3)  [address], [place] [phone]  [phone]   [facility] Child Abuse Hotline 737-285-6312 or [phone] (After Hours)

## 2015-05-12 NOTE — ED Provider Notes (Signed)
CSN: [id number]     Arrival date & time 05/12/15  1318 History  This chart was scribed for non-physician practitioner, Raymon Mutton, PA-C, working with Mancel Bale, MD, by Abel Presto, ED Scribe. This patient was seen in room TR01C/TR01C and the patient's care was started at 3:05 PM.    Chief Complaint  Patient presents with  . Abdominal Pain     The history is provided by the patient. No language interpreter was used.   HPI Comments: [name] is a 29 y.o. female with no significant PMHx who presents to the Emergency Department complaining of waxing and waning sharp suprapubic abdominal pain with acute onset 3 days ago, worsening yesterday. Patient reported that the pain started on the right side of her abdomen but is now localized to bilateral sides of her pubic region. Pt states pain radiates into the periumbilical area. She states burping and coughing worsens the pain. Pt notes associated nausea and urgency which has partially resolved. Pt took Aleve 3 days ago, but has taken nothing since. She is able to tolerate PO intake and denies decreased appetite. She denies recent sick contacts. Pt's LNMP was 04/19/15. She denies bcp use, but is sexually active and does not use protection.  Pt's last normal BM was earlier this afternoon reported that it was normal. She denies fever, chills, vomiting, diarrhea, hematochezia, melena, chest pain, SOB, difficulty breathing, low back pain, dysuria, hematuria, urinary retention, vaginal bleeding, vaginal discharge, vaginal irritation. Denies breast-feeding. LMP 04/19/2015. PCP none   Past Medical History  Diagnosis Date  . No pertinent past medical history   . Medical history non-contributory   . Pregnancy induced hypertension    Past Surgical History  Procedure Laterality Date  . Cesarean section    . Dilation and curettage of uterus    . Wisdom tooth extraction    . Cesarean section with bilateral tubal ligation  03/02/2014    Procedure:  Repeat CESAREAN SECTION WITH BILATERAL TUBAL LIGATION;  Surgeon: Antionette Char, MD;  Location: [facility];  Service: Obstetrics;;   Family History  Problem Relation Age of Onset  . Diabetes Mother   . Hypertension Father    History  Substance Use Topics  . Smoking status: Current Every Day Smoker -- 0.50 packs/day  . Smokeless tobacco: Never Used  . Alcohol Use: No   OB History    Gravida Para Term Preterm AB TAB SAB Ectopic Multiple Living   Review of Systems  Constitutional: Negative for fever, chills and appetite change.  Respiratory: Negative for shortness of breath.   Cardiovascular: Negative for chest pain.  Gastrointestinal: Positive for nausea and abdominal pain. Negative for vomiting, diarrhea and blood in stool.  Genitourinary: Positive for urgency. Negative for dysuria, hematuria, decreased urine volume, vaginal bleeding, vaginal discharge and difficulty urinating.  Musculoskeletal: Negative for back pain.      Allergies  Review of patient's allergies indicates no known allergies.  Home Medications   Prior to Admission medications   Medication Sig Start Date End Date Taking? Authorizing Provider  amoxicillin-clavulanate (AUGMENTIN) 875-125 MG per tablet Take 1 tablet by mouth 2 (two) times daily. 03/09/14   Brock Bad, MD  ibuprofen (ADVIL,MOTRIN) 600 MG tablet Take 1 tablet (600 mg total) by mouth every 6 (six) hours as needed. 03/05/14   Brock Bad, MD  metroNIDAZOLE (FLAGYL) 500 MG tablet Take 1 tablet (500 mg total) by mouth  2 (two) times daily. 05/12/15   Grigor Lipschutz, PA-C  oxyCODONE-acetaminophen (PERCOCET/ROXICET) 5-325 MG per tablet Take 1-2 tablets by mouth every 4 (four) hours as needed for severe pain (moderate - severe pain). 03/05/14   Brock Bad, MD  Prenatal Vit-FePoly-FA-DHA (VITAFOL-ONE) 29-1-200 MG CAPS Take 1 capsule by mouth daily. 09/07/13   Amy H Wren, CNM   BP 120/82 mmHg  Pulse 77  Temp(Src) 98.7 F  (37.1 C) (Oral)  Resp 18  SpO2 100%  LMP 04/19/2015 Physical Exam  Constitutional: She is oriented to person, place, and time. She appears well-developed and well-nourished. No distress.  HENT:  Head: Normocephalic and atraumatic.  Eyes: Conjunctivae and EOM are normal. Right eye exhibits no discharge. Left eye exhibits no discharge.  Neck: Normal range of motion. Neck supple.  Cardiovascular: Normal rate, regular rhythm and normal heart sounds.   Pulses:      Radial pulses are 2+ on the right side, and 2+ on the left side.       Dorsalis pedis pulses are 2+ on the right side, and 2+ on the left side.  Pulmonary/Chest: Effort normal and breath sounds normal. No respiratory distress. She has no wheezes. She has no rales.  Abdominal: Soft. Bowel sounds are normal. She exhibits no distension. There is tenderness in the right lower quadrant, suprapubic area and left lower quadrant. There is no rebound, no guarding and no CVA tenderness.  Genitourinary:  Pelvic exam: Negative swelling, erythema, inflammation, lesions, sores, deformities, areas of fluctuance or induration identified to the external genitalia. Negative bright red blood in the vaginal vault. Cervix identifiable negative friability. Thick white discharge with odor identified. Positive CMT and adnexal tenderness bilaterally. Exam chaperoned with nurse, Guerry Minors  Musculoskeletal: Normal range of motion.  Neurological: She is alert and oriented to person, place, and time. No cranial nerve deficit. She exhibits normal muscle tone. Coordination normal. GCS eye subscore is 4. GCS verbal subscore is 5. GCS motor subscore is 6.  Skin: Skin is warm and dry. No rash noted. She is not diaphoretic. No erythema.  Psychiatric: She has a normal mood and affect. Her behavior is normal. Thought content normal.  Nursing note and vitals reviewed.   ED Course  Procedures (including critical care time) DIAGNOSTIC STUDIES: Oxygen Saturation  is 97% on room air, normal by my interpretation.    COORDINATION OF CARE: 3:11 PM Discussed treatment plan with patient at beside, the patient agrees with the plan and has no further questions at this time.   Results for orders placed or performed during the hospital encounter of 05/12/15  Wet prep, genital  Result Value Ref Range   Yeast Wet Prep HPF POC NONE SEEN NONE SEEN   Trich, Wet Prep NONE SEEN NONE SEEN   Clue Cells Wet Prep HPF POC FEW (A) NONE SEEN   WBC, Wet Prep HPF POC FEW (A) NONE SEEN  CBC WITH DIFFERENTIAL  Result Value Ref Range   WBC 6.2 4.0 - 10.5 K/uL   RBC 4.65 3.87 - 5.11 MIL/uL   Hemoglobin 10.1 (L) 12.0 - 15.0 g/dL   HCT 76.7 (L) 34.1 - 93.7 %   MCV 71.6 (L) 78.0 - 100.0 fL   MCH 21.7 (L) 26.0 - 34.0 pg   MCHC 30.3 30.0 - 36.0 g/dL   RDW 90.2 (H) 40.9 - 73.5 %   Platelets 380 150 - 400 K/uL   Neutrophils Relative % 46 43 - 77 %   Lymphocytes Relative 42 12 -  46 %   Monocytes Relative 7 3 - 12 %   Eosinophils Relative 4 0 - 5 %   Basophils Relative 1 0 - 1 %   Neutro Abs 2.9 1.7 - 7.7 K/uL   Lymphs Abs 2.6 0.7 - 4.0 K/uL   Monocytes Absolute 0.4 0.1 - 1.0 K/uL   Eosinophils Absolute 0.2 0.0 - 0.7 K/uL   Basophils Absolute 0.1 0.0 - 0.1 K/uL   RBC Morphology POLYCHROMASIA PRESENT    WBC Morphology ATYPICAL LYMPHOCYTES    Smear Review LARGE PLATELETS PRESENT   Comprehensive metabolic panel  Result Value Ref Range   Sodium 140 135 - 145 mmol/L   Potassium 3.8 3.5 - 5.1 mmol/L   Chloride 109 101 - 111 mmol/L   CO2 25 22 - 32 mmol/L   Glucose, Bld 108 (H) 65 - 99 mg/dL   BUN 7 6 - 20 mg/dL   Creatinine, Ser 1.61 0.44 - 1.00 mg/dL   Calcium 9.0 8.9 - 09.6 mg/dL   Total Protein 6.5 6.5 - 8.1 g/dL   Albumin 3.3 (L) 3.5 - 5.0 g/dL   AST 16 15 - 41 U/L   ALT 12 (L) 14 - 54 U/L   Alkaline Phosphatase 67 38 - 126 U/L   Total Bilirubin 0.2 (L) 0.3 - 1.2 mg/dL   GFR calc non Af Amer >60 >60 mL/min   GFR calc Af Amer >60 >60 mL/min   Anion gap 6 5 - 15   Urinalysis, Routine w reflex microscopic (not at [facility])  Result Value Ref Range   Color, Urine AMBER (A) YELLOW   APPearance CLOUDY (A) CLEAR   Specific Gravity, Urine 1.033 (H) 1.005 - 1.030   pH 7.0 5.0 - 8.0   Glucose, UA NEGATIVE NEGATIVE mg/dL   Hgb urine dipstick NEGATIVE NEGATIVE   Bilirubin Urine NEGATIVE NEGATIVE   Ketones, ur 15 (A) NEGATIVE mg/dL   Protein, ur NEGATIVE NEGATIVE mg/dL   Urobilinogen, UA 1.0 0.0 - 1.0 mg/dL   Nitrite NEGATIVE NEGATIVE   Leukocytes, UA NEGATIVE NEGATIVE  Lipase, blood  Result Value Ref Range   Lipase 21 (L) 22 - 51 U/L  POC Urine Preg, ED (not at [facility])  Result Value Ref Range   Preg Test, Ur NEGATIVE NEGATIVE    Labs Review Labs Reviewed  WET PREP, GENITAL - Abnormal; Notable for the following:    Clue Cells Wet Prep HPF POC FEW (*)    WBC, Wet Prep HPF POC FEW (*)    All other components within normal limits  CBC WITH DIFFERENTIAL/PLATELET - Abnormal; Notable for the following:    Hemoglobin 10.1 (*)    HCT 33.3 (*)    MCV 71.6 (*)    MCH 21.7 (*)    RDW 16.6 (*)    All other components within normal limits  COMPREHENSIVE METABOLIC PANEL - Abnormal; Notable for the following:    Glucose, Bld 108 (*)    Albumin 3.3 (*)    ALT 12 (*)    Total Bilirubin 0.2 (*)    All other components within normal limits  URINALYSIS, ROUTINE W REFLEX MICROSCOPIC (NOT AT [facility]) - Abnormal; Notable for the following:    Color, Urine AMBER (*)    APPearance CLOUDY (*)    Specific Gravity, Urine 1.033 (*)    Ketones, ur 15 (*)    All other components within normal limits  LIPASE, BLOOD - Abnormal; Notable for the following:    Lipase 21 (*)  All other components within normal limits  POC URINE PREG, ED  GC/CHLAMYDIA PROBE AMP ([place]) NOT AT [facility]    Imaging Review US Transvaginal Non-ob  05/12/2015   CLINICAL DATA:  Right-sided pelvic pain for 3 days. LMP 04/19/2015. Clinical suspicion for ovarian torsion.  EXAM: TRANSABDOMINAL AND  TRANSVAGINAL ULTRASOUND OF PELVIS  DOPPLER ULTRASOUND OF OVARIES  TECHNIQUE: Both transabdominal and transvaginal ultrasound examinations of the pelvis were performed. Transabdominal technique was performed for global imaging of the pelvis including uterus, ovaries, adnexal regions, and pelvic cul-de-sac.  It was necessary to proceed with endovaginal exam following the transabdominal exam to visualize the ovaries and adnexae. Color and duplex Doppler ultrasound was utilized to evaluate blood flow to the ovaries.  COMPARISON:  None.  FINDINGS: Uterus  Measurements: 10.9 x 4.7 x 5.8 cm. No fibroids or other mass visualized.  Endometrium  Thickness: 10 mm.  No focal abnormality visualized.  Right ovary  Measurements: 4.3 x 2.5 x 2.8 cm. Small corpus luteum noted. Otherwise normal appearance/no adnexal mass.  Left ovary  Measurements: Not directly visualized by transabdominal or transvaginal sonography, however no adnexal mass identified.  Pulsed Doppler evaluation of the right ovary demonstrates normal low-resistance arterial and venous waveforms.  Other findings  No free fluid.  IMPRESSION: Normal appearance of uterus and right ovary. No sonographic evidence for right ovarian torsion.  Nonvisualization of left ovary, however no adnexal mass or other sonographic abnormality identified.   Electronically Signed   By: Myles Rosenthal M.D.   On: 05/12/2015 17:51   US Pelvis Complete  05/12/2015   CLINICAL DATA:  Right-sided pelvic pain for 3 days. LMP 04/19/2015. Clinical suspicion for ovarian torsion.  EXAM: TRANSABDOMINAL AND TRANSVAGINAL ULTRASOUND OF PELVIS  DOPPLER ULTRASOUND OF OVARIES  TECHNIQUE: Both transabdominal and transvaginal ultrasound examinations of the pelvis were performed. Transabdominal technique was performed for global imaging of the pelvis including uterus, ovaries, adnexal regions, and pelvic cul-de-sac.  It was necessary to proceed with endovaginal exam following the transabdominal exam to  visualize the ovaries and adnexae. Color and duplex Doppler ultrasound was utilized to evaluate blood flow to the ovaries.  COMPARISON:  None.  FINDINGS: Uterus  Measurements: 10.9 x 4.7 x 5.8 cm. No fibroids or other mass visualized.  Endometrium  Thickness: 10 mm.  No focal abnormality visualized.  Right ovary  Measurements: 4.3 x 2.5 x 2.8 cm. Small corpus luteum noted. Otherwise normal appearance/no adnexal mass.  Left ovary  Measurements: Not directly visualized by transabdominal or transvaginal sonography, however no adnexal mass identified.  Pulsed Doppler evaluation of the right ovary demonstrates normal low-resistance arterial and venous waveforms.  Other findings  No free fluid.  IMPRESSION: Normal appearance of uterus and right ovary. No sonographic evidence for right ovarian torsion.  Nonvisualization of left ovary, however no adnexal mass or other sonographic abnormality identified.   Electronically Signed   By: Myles Rosenthal M.D.   On: 05/12/2015 17:51   Ct Abdomen Pelvis W Contrast  05/12/2015   CLINICAL DATA:  29 year old female with periumbilical pain and nausea  EXAM: CT ABDOMEN AND PELVIS WITH CONTRAST  TECHNIQUE: Multidetector CT imaging of the abdomen and pelvis was performed using the standard protocol following bolus administration of intravenous contrast.  CONTRAST:  OMNIPAQUE IOHEXOL 300 MG/ML  SOLN  COMPARISON:  Transvaginal ultrasound performed earlier today  FINDINGS: Lower Chest: The lung bases are clear. Visualized cardiac structures are within normal limits for size. No pericardial effusion. Unremarkable visualized distal thoracic esophagus.  Abdomen: Unremarkable CT appearance of the stomach, duodenum, spleen, adrenal glands and pancreas. Normal hepatic contour and morphology. No discrete hepatic lesion. Gallbladder is unremarkable. No intra or extrahepatic biliary ductal dilatation.  Unremarkable appearance of the bilateral kidneys. No focal solid lesion, hydronephrosis or  nephrolithiasis.  No evidence of obstruction or focal bowel wall thickening. Normal appendix in the right lower quadrant. The terminal ileum is unremarkable. No free fluid or suspicious adenopathy.  Pelvis: Unremarkable uterus and adnexa. The left ovary is slightly high in position but appears otherwise unremarkable. The bladder is decompressed. No free fluid or adenopathy.  Bones/Soft Tissues: Small omental fat containing umbilical hernia. There is an 11 mm soft tissue component in the herniated omental fat with faint surrounding interstitial stranding. No acute fracture or aggressive appearing lytic or blastic osseous lesion.  Vascular: No significant atherosclerotic vascular disease, aneurysmal dilatation or acute abnormality.  IMPRESSION: 1. Small omental fat containing umbilical hernia. Soft tissue density and interstitial stranding within the herniated omental fat likely represents a small omental infarct with fat necrosis. This could represent a source for localized superficial umbilical pain. 2. Otherwise, no acute abnormality in the abdomen or pelvis. Specifically, the appendix is normal.   Electronically Signed   By: Malachy Moan M.D.   On: 05/12/2015 18:57   [place] Art/ven Flow Abd Pelv Doppler  05/12/2015   CLINICAL DATA:  Right-sided pelvic pain for 3 days. LMP 04/19/2015. Clinical suspicion for ovarian torsion.  EXAM: TRANSABDOMINAL AND TRANSVAGINAL ULTRASOUND OF PELVIS  DOPPLER ULTRASOUND OF OVARIES  TECHNIQUE: Both transabdominal and transvaginal ultrasound examinations of the pelvis were performed. Transabdominal technique was performed for global imaging of the pelvis including uterus, ovaries, adnexal regions, and pelvic cul-de-sac.  It was necessary to proceed with endovaginal exam following the transabdominal exam to visualize the ovaries and adnexae. Color and duplex Doppler ultrasound was utilized to evaluate blood flow to the ovaries.  COMPARISON:  None.  FINDINGS: Uterus  Measurements:  10.9 x 4.7 x 5.8 cm. No fibroids or other mass visualized.  Endometrium  Thickness: 10 mm.  No focal abnormality visualized.  Right ovary  Measurements: 4.3 x 2.5 x 2.8 cm. Small corpus luteum noted. Otherwise normal appearance/no adnexal mass.  Left ovary  Measurements: Not directly visualized by transabdominal or transvaginal sonography, however no adnexal mass identified.  Pulsed Doppler evaluation of the right ovary demonstrates normal low-resistance arterial and venous waveforms.  Other findings  No free fluid.  IMPRESSION: Normal appearance of uterus and right ovary. No sonographic evidence for right ovarian torsion.  Nonvisualization of left ovary, however no adnexal mass or other sonographic abnormality identified.   Electronically Signed   By: Myles Rosenthal M.D.   On: 05/12/2015 17:51     EKG Interpretation None       8:39 PM Discussed case in great detail with Dr. Hillery Jacks. Images and labs have been reviewed. As per attending cleared patient for discharge. Recommended patient be followed up with PCP.  MDM   Final diagnoses:  Pelvic pain in female  Abdominal pain, unspecified abdominal location  Umbilical hernia without obstruction and without gangrene  BV (bacterial vaginosis)    Medications  sodium chloride 0.9 % bolus 1,000 mL (0 mLs Intravenous Stopped 05/12/15 2228)  iohexol (OMNIPAQUE) 300 MG/ML solution 25 mL (25 mLs Oral Contrast Given 05/12/15 1801)  iohexol (OMNIPAQUE) 300 MG/ML solution 100 mL (100 mLs Intravenous Contrast Given 05/12/15 1830)  cefTRIAXone (ROCEPHIN) injection 250 mg (250 mg Intramuscular Given 05/12/15 2243)  azithromycin [facility]) tablet 1,000 mg (1,000 mg Oral Given 05/12/15 2242)  lidocaine (PF) (XYLOCAINE) 1 % injection 0.9 mL (0.9 mLs Infiltration Given 05/12/15 2243)    Filed Vitals:   05/12/15 1333 05/12/15 1601 05/12/15 1955 05/12/15 2213  BP: 150/86 119/65 127/72 120/82  Pulse: 89 80 72 77  Temp: 98.7 F (37.1 C)     TempSrc: Oral      Resp: 16 18 18 18   SpO2: 97% 100% 100% 100%   I personally performed the services described in this documentation, which was scribed in my presence. The recorded information has been reviewed and is accurate.   CBC negative elevated leukocytosis. Hemoglobin 10.1, hematocrit 33.3 - when to compared to previous labs patient's hemoglobin has been as low as 8.4, appears to be around the same-patient is at baseline. CMP unremarkable. Glucose 108 with negative elevated anion gap. Lipase negative elevation. Urine pregnancy negative. Urinalysis negative hemoglobin, nitrites, leukocytes-negative findings of infection. Elevated specific gravity of 1.033 and mild ketones noted 15. Wet prep negative for yeast, Trichomonas, few clue cells and white blood cells identified. Pelvic ultrasound normal appearance of uterus and right ovary were no sonographic evidence of ovarian torsion. Nonvisualization of the left ovary, no adnexal mass or other sonographic abnormalities noted. CT abdomen and pelvis with contrast noted small omental fat-containing umbilical hernia, tissue density and interstitial stranding within the herniated omental fat likely represents a small omental infarct with fat necrosis. This could represent a source of localized superficial umbilical pain. No acute abnormality in the abdomen or pelvis. The appendix is normal. On CT the left ovary is slightly high in position, but appears unremarkable. Negative findings of appendicitis. Umbilical hernia identified that is fat-containing, mild small omental infarct are noted with fat necrosis-discussed this in great detail with attending physician who reported that this is nonspecific. Negative findings of PID. Negative findings of ovarian torsion. Negative findings of UTI or pyelonephritis. Treated patient prophylactically secondary to finding vaginal discharge that was odorous on physical examination. Patient stable, afebrile. Patient not septic appearing.  Negative signs of respiratory distress. Discharged patient. Discussed with patient to avoid any physical or strenuous activity. Discussed with patient to no drink alcohol with Flagyl. [facility]N. Discussed with patient to closely monitor symptoms and if symptoms are to worsen or change to report back to the ED - strict return instructions given.  Patient agreed to plan of care, understood, all questions answered.   Raymon Mutton, PA-C 05/12/15 2242  Raymon Mutton, PA-C 05/12/15 2247  Raymon Mutton, PA-C 05/13/15 1224  Purvis Sheffield, MD 05/14/15 [id number]

## 2015-05-13 LAB — GC/CHLAMYDIA PROBE AMP (~~LOC~~) NOT AT ARMC
Chlamydia: NEGATIVE
NEISSERIA GONORRHEA: NEGATIVE

## 2016-07-04 ENCOUNTER — Other Ambulatory Visit: Payer: Self-pay | Admitting: Infectious Disease

## 2016-07-04 ENCOUNTER — Ambulatory Visit
Admission: RE | Admit: 2016-07-04 | Discharge: 2016-07-04 | Disposition: A | Discharge status: Home / Self Care | Payer: No Typology Code available for payment source | Source: Ambulatory Visit | Attending: Infectious Disease | Admitting: Infectious Disease

## 2016-07-04 DIAGNOSIS — R7611 Nonspecific reaction to tuberculin skin test without active tuberculosis: Secondary | ICD-10-CM

## 2017-12-21 ENCOUNTER — Encounter (HOSPITAL_COMMUNITY): Payer: Self-pay | Admitting: Emergency Medicine

## 2017-12-21 ENCOUNTER — Ambulatory Visit (HOSPITAL_COMMUNITY)
Admission: EM | Admit: 2017-12-21 | Discharge: 2017-12-21 | Disposition: A | Discharge status: Home / Self Care | Payer: Self-pay | Attending: Family Medicine | Admitting: Family Medicine

## 2017-12-21 DIAGNOSIS — J069 Acute upper respiratory infection, unspecified: Secondary | ICD-10-CM

## 2017-12-21 DIAGNOSIS — B9789 Other viral agents as the cause of diseases classified elsewhere: Secondary | ICD-10-CM

## 2017-12-21 MED ORDER — ALBUTEROL SULFATE HFA 108 (90 BASE) MCG/ACT IN AERS: 2.0000 | INHALATION_SPRAY | RESPIRATORY_TRACT | 1 refills | Status: DC | PRN

## 2017-12-21 MED ORDER — SPACER/AERO CHAMBER MOUTHPIECE MISC: 1.0000 | Freq: Four times a day (QID) | 1 refills | Status: DC | PRN

## 2017-12-21 MED ORDER — HYDROCODONE-HOMATROPINE 5-1.5 MG/5ML PO SYRP: 5.0000 mL | ORAL_SOLUTION | Freq: Four times a day (QID) | ORAL | 0 refills | Status: DC | PRN

## 2017-12-21 NOTE — ED Triage Notes (Signed)
PT C/O: cold sx onset 3 days associated w/CP, SOB, cough, nasal drainage/congestion  DENIES: fevers  TAKING MEDS: Alka seltzer   A&O x4... NAD... Ambulatory

## 2017-12-21 NOTE — ED Provider Notes (Signed)
[facility]   [id number] 12/21/17 Arrival Time: 1014   SUBJECTIVE:  [name] is a 32 y.o. female who presents to the urgent care with complaint of cold sx onset 3 days associated w/CP, SOB, cough, nasal drainage/congestion  Works in college setting helping students  Past Medical History:  Diagnosis Date  . Medical history non-contributory   . No pertinent past medical history   . Pregnancy induced hypertension    Family History  Problem Relation Age of Onset  . Diabetes Mother   . Hypertension Father    Social History   Socioeconomic History  . Marital status: Single    Spouse name: Not on file  . Number of children: Not on file  . Years of education: Not on file  . Highest education level: Not on file  Social Needs  . Financial resource strain: Not on file  . Food insecurity - worry: Not on file  . Food insecurity - inability: Not on file  . Transportation needs - medical: Not on file  . Transportation needs - non-medical: Not on file  Occupational History  . Not on file  Tobacco Use  . Smoking status: Current Every Day Smoker    Packs/day: 0.50  . Smokeless tobacco: Never Used  Substance and Sexual Activity  . Alcohol use: No  . Drug use: No  . Sexual activity: Not Currently    Birth control/protection: Abstinence, None, Surgical  Other Topics Concern  . Not on file  Social History Narrative  . Not on file   No outpatient medications have been marked as taking for the 12/21/17 encounter [facility](Hospital Encounter).   No Known Allergies    ROS: As per HPI, remainder of ROS negative.   OBJECTIVE:   Vitals:   12/21/17 1058  BP: 138/74  Pulse: 91  Resp: 20  Temp: 98.2 F (36.8 C)  TempSrc: Oral  SpO2: 100%     General appearance: alert; no distress Eyes: PERRL; EOMI; conjunctiva normal HENT: normocephalic; atraumatic; TMs normal, canal normal, external ears normal without trauma; nasal mucosa normal; oral mucosa normal Neck:  supple Lungs: clear to auscultation bilaterally Heart: regular rate and rhythm Back: no CVA tenderness Extremities: no cyanosis or edema; symmetrical with no gross deformities Skin: warm and dry Neurologic: normal gait; grossly normal Psychological: alert and cooperative; normal mood and affect      Labs:  Results for orders placed or performed during the hospital encounter of 05/12/15  Wet prep, genital  Result Value Ref Range   Yeast Wet Prep HPF POC NONE SEEN NONE SEEN   Trich, Wet Prep NONE SEEN NONE SEEN   Clue Cells Wet Prep HPF POC FEW (A) NONE SEEN   WBC, Wet Prep HPF POC FEW (A) NONE SEEN  CBC WITH DIFFERENTIAL  Result Value Ref Range   WBC 6.2 4.0 - 10.5 K/uL   RBC 4.65 3.87 - 5.11 MIL/uL   Hemoglobin 10.1 (L) 12.0 - 15.0 g/dL   HCT 40.933.3 (L) 81.136.0 - 91.446.0 %   MCV 71.6 (L) 78.0 - 100.0 fL   MCH 21.7 (L) 26.0 - 34.0 pg   MCHC 30.3 30.0 - 36.0 g/dL   RDW 78.216.6 (H) 95.611.5 - 21.315.5 %   Platelets 380 150 - 400 K/uL   Neutrophils Relative % 46 43 - 77 %   Lymphocytes Relative 42 12 - 46 %   Monocytes Relative 7 3 - 12 %   Eosinophils Relative 4 0 - 5 %  Basophils Relative 1 0 - 1 %   Neutro Abs 2.9 1.7 - 7.7 K/uL   Lymphs Abs 2.6 0.7 - 4.0 K/uL   Monocytes Absolute 0.4 0.1 - 1.0 K/uL   Eosinophils Absolute 0.2 0.0 - 0.7 K/uL   Basophils Absolute 0.1 0.0 - 0.1 K/uL   RBC Morphology POLYCHROMASIA PRESENT    WBC Morphology ATYPICAL LYMPHOCYTES    Smear Review LARGE PLATELETS PRESENT   Comprehensive metabolic panel  Result Value Ref Range   Sodium 140 135 - 145 mmol/L   Potassium 3.8 3.5 - 5.1 mmol/L   Chloride 109 101 - 111 mmol/L   CO2 25 22 - 32 mmol/L   Glucose, Bld 108 (H) 65 - 99 mg/dL   BUN 7 6 - 20 mg/dL   Creatinine, Ser 1.61 0.44 - 1.00 mg/dL   Calcium 9.0 8.9 - 09.6 mg/dL   Total Protein 6.5 6.5 - 8.1 g/dL   Albumin 3.3 (L) 3.5 - 5.0 g/dL   AST 16 15 - 41 U/L   ALT 12 (L) 14 - 54 U/L   Alkaline Phosphatase 67 38 - 126 U/L   Total Bilirubin 0.2 (L) 0.3  - 1.2 mg/dL   GFR calc non Af Amer >60 >60 mL/min   GFR calc Af Amer >60 >60 mL/min   Anion gap 6 5 - 15  Urinalysis, Routine w reflex microscopic (not at [facility])  Result Value Ref Range   Color, Urine AMBER (A) YELLOW   APPearance CLOUDY (A) CLEAR   Specific Gravity, Urine 1.033 (H) 1.005 - 1.030   pH 7.0 5.0 - 8.0   Glucose, UA NEGATIVE NEGATIVE mg/dL   Hgb urine dipstick NEGATIVE NEGATIVE   Bilirubin Urine NEGATIVE NEGATIVE   Ketones, ur 15 (A) NEGATIVE mg/dL   Protein, ur NEGATIVE NEGATIVE mg/dL   Urobilinogen, UA 1.0 0.0 - 1.0 mg/dL   Nitrite NEGATIVE NEGATIVE   Leukocytes, UA NEGATIVE NEGATIVE  Lipase, blood  Result Value Ref Range   Lipase 21 (L) 22 - 51 U/L  POC Urine Preg, ED (not at [facility])  Result Value Ref Range   Preg Test, Ur NEGATIVE NEGATIVE  GC/Chlamydia probe amp ([place])not at [facility]  Result Value Ref Range   Chlamydia Negative    Neisseria gonorrhea Negative     Labs Reviewed - No data to display  No results found.     ASSESSMENT & PLAN:  1. Viral URI with cough     Meds ordered this encounter  Medications  . albuterol (PROVENTIL HFA;VENTOLIN HFA) 108 (90 Base) MCG/ACT inhaler    Sig: Inhale 2 puffs into the lungs every 4 (four) hours as needed for wheezing or shortness of breath (cough, shortness of breath or wheezing.).    Dispense:  1 Inhaler    Refill:  1  . Spacer/Aero Chamber Mouthpiece MISC    Sig: 1 Device by Does not apply route every 6 (six) hours as needed.    Dispense:  1 each    Refill:  1  . HYDROcodone-homatropine (HYDROMET) 5-1.5 MG/5ML syrup    Sig: Take 5 mLs by mouth every 6 (six) hours as needed for cough.    Dispense:  60 mL    Refill:  0    Reviewed expectations re: course of current medical issues. Questions answered. Outlined signs and symptoms indicating need for more acute intervention. Patient verbalized understanding. After Visit Summary given.      Elvina Sidle, MD 12/21/17 1136

## 2017-12-21 NOTE — Discharge Instructions (Signed)
Two good books: [employer]

## 2018-07-07 DIAGNOSIS — D649 Anemia, unspecified: Secondary | ICD-10-CM | POA: Diagnosis not present

## 2018-07-07 DIAGNOSIS — I10 Essential (primary) hypertension: Secondary | ICD-10-CM | POA: Diagnosis not present

## 2018-07-08 ENCOUNTER — Encounter (HOSPITAL_COMMUNITY): Payer: Self-pay | Admitting: Emergency Medicine

## 2018-07-08 ENCOUNTER — Emergency Department (HOSPITAL_COMMUNITY)
Admission: EM | Admit: 2018-07-08 | Discharge: 2018-07-08 | Disposition: A | Discharge status: Home / Self Care | Payer: Commercial Managed Care - PPO | Attending: Emergency Medicine | Admitting: Emergency Medicine

## 2018-07-08 DIAGNOSIS — I10 Essential (primary) hypertension: Secondary | ICD-10-CM

## 2018-07-08 DIAGNOSIS — R42 Dizziness and giddiness: Secondary | ICD-10-CM | POA: Diagnosis present

## 2018-07-08 DIAGNOSIS — D649 Anemia, unspecified: Secondary | ICD-10-CM | POA: Diagnosis not present

## 2018-07-08 DIAGNOSIS — R6889 Other general symptoms and signs: Secondary | ICD-10-CM

## 2018-07-08 DIAGNOSIS — F17200 Nicotine dependence, unspecified, uncomplicated: Secondary | ICD-10-CM | POA: Insufficient documentation

## 2018-07-08 LAB — BASIC METABOLIC PANEL
Anion gap: 7 (ref 5–15)
BUN: 12 mg/dL (ref 6–20)
CO2: 23 mmol/L (ref 22–32)
CREATININE: 0.76 mg/dL (ref 0.44–1.00)
Calcium: 8.8 mg/dL — ABNORMAL LOW (ref 8.9–10.3)
Chloride: 110 mmol/L (ref 98–111)
GLUCOSE: 109 mg/dL — AB (ref 70–99)
Potassium: 3.8 mmol/L (ref 3.5–5.1)
SODIUM: 140 mmol/L (ref 135–145)

## 2018-07-08 LAB — CBC
HCT: 34.4 % — ABNORMAL LOW (ref 36.0–46.0)
Hemoglobin: 9.9 g/dL — ABNORMAL LOW (ref 12.0–15.0)
MCH: 20 pg — ABNORMAL LOW (ref 26.0–34.0)
MCHC: 28.8 g/dL — AB (ref 30.0–36.0)
MCV: 69.5 fL — AB (ref 78.0–100.0)
PLATELETS: 416 10*3/uL — AB (ref 150–400)
RBC: 4.95 MIL/uL (ref 3.87–5.11)
RDW: 17.5 % — AB (ref 11.5–15.5)
WBC: 6.7 10*3/uL (ref 4.0–10.5)

## 2018-07-08 LAB — I-STAT BETA HCG BLOOD, ED (MC, WL, AP ONLY)

## 2018-07-08 LAB — CBG MONITORING, ED: Glucose-Capillary: 106 mg/dL — ABNORMAL HIGH (ref 70–99)

## 2018-07-08 MED ORDER — FERROUS SULFATE 325 (65 FE) MG PO TABS: 325.0000 mg | ORAL_TABLET | Freq: Every day | ORAL | 0 refills | Status: DC

## 2018-07-08 MED ORDER — AMLODIPINE BESYLATE 5 MG PO TABS: 5.0000 mg | ORAL_TABLET | Freq: Every day | ORAL | 0 refills | Status: DC

## 2018-07-08 NOTE — ED Provider Notes (Signed)
[facility]   CSN: [id number] Arrival date & time: 07/08/18  1439     History   Chief Complaint Chief [id number]  Patient presents with  . Dizziness  . Hypertension  . Headache    HPI [name] is a 32 y.o. female.  32 year old female who presents with feeling dizzy and hypertension.  Patient states that for the past few weeks, she has occasionally checked her blood pressure at [facility] and it has been elevated 140s/90s.  She got one value that was 170s.  She denies any history of high blood pressure but has schedule an appointment with her primary care doctor next week.  Today after lunch, she was sitting at work when she started feeling strange.  She describes it as feeling cloudy headed, lightheaded, and "off." She slowly developed mild dull headache but no severe headache and no vision changes. No CP, SOB, N/V, URI symptoms, or recent illness. Symptoms are currently resolved but she is still concerned about her blood pressure. Denies drug use.   Irving Burtonmily history notable for mother and father with hypertension.  The history is provided by the patient.  Dizziness  Associated symptoms: headaches   Hypertension  Associated symptoms include headaches.  Headache      Past Medical History:  Diagnosis Date  . Medical history non-contributory   . No pertinent past medical history   . Pregnancy induced hypertension     Patient Active Problem List   Diagnosis Date Noted  . Furuncle of pubic region 03/09/2014  . Indication for care in labor and delivery, antepartum 03/02/2014  . Cesarean delivery delivered 03/02/2014  . H/O: C-section 12/05/2013  . History of pre-eclampsia 12/05/2013  . Desires Tubal Ligation w/ Repeat C-section 12/05/2013  . Abnormal quad screen 10/14/2013  . Vitamin D deficiency 08/22/2013  . Obesity 08/07/2013  . Family history of diabetes mellitus (DM) 08/07/2013  . Supervision of other normal pregnancy  08/07/2013    Past Surgical History:  Procedure Laterality Date  . CESAREAN SECTION    . CESAREAN SECTION WITH BILATERAL TUBAL LIGATION  03/02/2014   Procedure: Repeat CESAREAN SECTION WITH BILATERAL TUBAL LIGATION;  Surgeon: Antionette CharLisa Jackson-Moore, MD;  Location: [facility];  Service: Obstetrics;;  . DILATION AND CURETTAGE OF UTERUS    . WISDOM TOOTH EXTRACTION       OB History    Gravida  6   Para  3   Term  3   Preterm      AB  3   Living  3     SAB  1   TAB  2   Ectopic      Multiple      Live Births  3            Home Medications    Prior to Admission medications   Medication Sig Start Date End Date Taking? Authorizing Provider  albuterol (PROVENTIL HFA;VENTOLIN HFA) 108 (90 Base) MCG/ACT inhaler Inhale 2 puffs into the lungs every 4 (four) hours as needed for wheezing or shortness of breath (cough, shortness of breath or wheezing.). 12/21/17   Elvina SidleLauenstein, Kurt, MD  amLODipine (NORVASC) 5 MG tablet Take 1 tablet (5 mg total) by mouth daily. 07/08/18   Little, Ambrose Finlandachel Morgan, MD  ferrous sulfate 325 (65 FE) MG tablet Take 1 tablet (325 mg total) by mouth daily. 07/08/18 08/07/18  Little, Ambrose Finlandachel Morgan, MD  HYDROcodone-homatropine (HYDROMET) 5-1.5 MG/5ML syrup Take 5 mLs by mouth every 6 (six)  hours as needed for cough. 12/21/17   Elvina Sidle, MD  ibuprofen (ADVIL,MOTRIN) 600 MG tablet Take 1 tablet (600 mg total) by mouth every 6 (six) hours as needed. 03/05/14   Brock Bad, MD  Prenatal Vit-FePoly-FA-DHA (VITAFOL-ONE) 29-1-200 MG CAPS Take 1 capsule by mouth daily. 09/07/13   Lanice Shirts, CNM  Spacer/Aero Chamber Mouthpiece MISC 1 Device by Does not apply route every 6 (six) hours as needed. 12/21/17   Elvina Sidle, MD    Family History Family History  Problem Relation Age of Onset  . Diabetes Mother   . Hypertension Father     Social History Social History   Tobacco Use  . Smoking status: Current Every Day Smoker    Packs/day: 0.50  . Smokeless  tobacco: Never Used  Substance Use Topics  . Alcohol use: No  . Drug use: No     Allergies   Patient has no known allergies.   Review of Systems Review of Systems  Neurological: Positive for dizziness and headaches.   All other systems reviewed and are negative except that which was mentioned in HPI   Physical Exam Updated Vital Signs BP (!) 174/102 (BP Location: Left Arm)   Pulse 85   Temp 98.6 F (37 C) (Oral)   Resp 16   Ht 5\' 6"  (1.676 m)   Wt (!) 141.1 kg   LMP 06/15/2018   SpO2 97%   BMI 50.20 kg/m   Physical Exam  Constitutional: She is oriented to person, place, and time. She appears well-developed and well-nourished. No distress.  HENT:  Head: Normocephalic and atraumatic.  Moist mucous membranes  Eyes: Pupils are equal, round, and reactive to light. Conjunctivae are normal.  Neck: Neck supple.  Cardiovascular: Normal rate, regular rhythm and normal heart sounds.  No murmur heard. Pulmonary/Chest: Effort normal and breath sounds normal.  Abdominal: Soft. Bowel sounds are normal. She exhibits no distension. There is no tenderness.  Musculoskeletal: She exhibits no edema.  Neurological: She is alert and oriented to person, place, and time.  Fluent speech  Skin: Skin is warm and dry.  Psychiatric: She has a normal mood and affect. Judgment normal.  Nursing note and vitals reviewed.    ED Treatments / Results  Labs (all labs ordered are listed, but only abnormal results are displayed) Labs Reviewed  BASIC METABOLIC PANEL - Abnormal; Notable for the following components:      Result Value   Glucose, Bld 109 (*)    Calcium 8.8 (*)    All other components within normal limits  CBC - Abnormal; Notable for the following components:   Hemoglobin 9.9 (*)    HCT 34.4 (*)    MCV 69.5 (*)    MCH 20.0 (*)    MCHC 28.8 (*)    RDW 17.5 (*)    Platelets 416 (*)    All other components within normal limits  CBG MONITORING, ED - Abnormal; Notable for the  following components:   Glucose-Capillary 106 (*)    All other components within normal limits  URINALYSIS, ROUTINE W REFLEX MICROSCOPIC  I-STAT BETA HCG BLOOD, ED ([facility], [facility], [facility] ONLY)    EKG EKG Interpretation  Date/Time:  Monday July 08 2018 14:57:38 EDT Ventricular Rate:  88 PR Interval:    QRS Duration: 81 QT Interval:  345 QTC Calculation: 418 R Axis:   21 Text Interpretation:  Sinus rhythm Low voltage, precordial leads Baseline wander in lead(s) II aVF No significant change since last  tracing Confirmed by Frederick PeersLittle, Rachel [phone]([id number]) on 07/08/2018 4:21:39 PM   Radiology No results found.  Procedures Procedures (including critical care time)  Medications Ordered in ED Medications - No data to display   Initial Impression / Assessment and Plan / ED Course  I have reviewed the triage vital signs and the nursing notes.  Pertinent labs that were available during my care of the patient were reviewed by me and considered in my medical decision making (see chart for details).     She was well-appearing on exam, hypertensive but remainder vital signs normal.  She denies any severe headache, chest pain, or other complaints to suggest hypertensive emergency.  EKG unchanged from previous, no ischemic changes.  Labs show normal creatinine, anemia of 9.9 which is similar to previous.  I discussed her anemia and need for iron supplementation.  Regarding her blood pressure, because she has had multiple values that are elevated and continues to be hypertensive here, will initiate treatment with amlodipine and have her follow-up with PCP next week for reassessment and further discussion of medication management.  Extensively reviewed return precautions and she voiced understanding. Final Clinical Impressions(s) / ED Diagnoses   Final diagnoses:  Essential hypertension  Feeling bad  Anemia, unspecified type    ED Discharge Orders         Ordered    amLODipine (NORVASC) 5 MG tablet  Daily      07/08/18 1639    ferrous sulfate 325 (65 FE) MG tablet  Daily     07/08/18 1639           Little, Ambrose Finlandachel Morgan, MD 07/08/18 1645

## 2018-07-08 NOTE — ED Triage Notes (Signed)
Pt reports today while sitting at her desk at work got really dizzy and felt like something was wrong. Reports has been keeping check on her BP which has been high and waiting to see PCP next week about it since no PMH of HTN.

## 2018-07-19 DIAGNOSIS — I10 Essential (primary) hypertension: Secondary | ICD-10-CM | POA: Diagnosis not present

## 2018-07-19 DIAGNOSIS — Z713 Dietary counseling and surveillance: Secondary | ICD-10-CM | POA: Diagnosis not present

## 2018-09-02 DIAGNOSIS — D500 Iron deficiency anemia secondary to blood loss (chronic): Secondary | ICD-10-CM | POA: Diagnosis not present

## 2018-09-02 DIAGNOSIS — N939 Abnormal uterine and vaginal bleeding, unspecified: Secondary | ICD-10-CM | POA: Diagnosis not present

## 2018-09-02 DIAGNOSIS — Z01411 Encounter for gynecological examination (general) (routine) with abnormal findings: Secondary | ICD-10-CM | POA: Diagnosis not present

## 2018-09-02 DIAGNOSIS — I10 Essential (primary) hypertension: Secondary | ICD-10-CM | POA: Diagnosis not present

## 2018-10-30 DIAGNOSIS — I10 Essential (primary) hypertension: Secondary | ICD-10-CM | POA: Diagnosis not present

## 2018-10-30 DIAGNOSIS — Z136 Encounter for screening for cardiovascular disorders: Secondary | ICD-10-CM | POA: Diagnosis not present

## 2018-10-30 DIAGNOSIS — D509 Iron deficiency anemia, unspecified: Secondary | ICD-10-CM | POA: Diagnosis not present

## 2018-11-06 ENCOUNTER — Ambulatory Visit (HOSPITAL_COMMUNITY)
Admission: RE | Admit: 2018-11-06 | Discharge: 2018-11-06 | Disposition: A | Discharge status: Home / Self Care | Payer: Commercial Managed Care - PPO | Source: Ambulatory Visit | Attending: Family Medicine | Admitting: Family Medicine

## 2018-11-06 DIAGNOSIS — D509 Iron deficiency anemia, unspecified: Secondary | ICD-10-CM | POA: Diagnosis present

## 2018-11-06 MED ORDER — SODIUM CHLORIDE 0.9 % IV SOLN
INTRAVENOUS | Status: DC | PRN
  Administered 2018-11-06: 250 mL via INTRAVENOUS

## 2018-11-06 MED ORDER — SODIUM CHLORIDE 0.9 % IV SOLN
510.0000 mg | Freq: Once | INTRAVENOUS | Status: AC
  Administered 2018-11-06: 510 mg via INTRAVENOUS
  Filled 2018-11-06: qty 17

## 2018-11-06 NOTE — Discharge Instructions (Signed)

## 2018-11-06 NOTE — Progress Notes (Signed)
PATIENT CARE CENTER NOTE  Diagnosis: Iron Deficiency Anemia    Provider: Horton MarshallAnna Becker, PA   Procedure: IV Feraheme    Note: Patient received Feraheme infusion. Monitored patient for 30 minutes post-infusion. Tolerated well with no adverse reaction. Vital signs stable. Discharge instructions given. Patient to come back next week for second dose of Feraheme. Patient alert, oriented, and ambulatory at discharge.

## 2018-11-13 ENCOUNTER — Ambulatory Visit (HOSPITAL_COMMUNITY)
Admission: RE | Admit: 2018-11-13 | Discharge: 2018-11-13 | Disposition: A | Discharge status: Home / Self Care | Payer: Commercial Managed Care - PPO | Source: Ambulatory Visit | Attending: Family Medicine | Admitting: Family Medicine

## 2018-11-13 DIAGNOSIS — D509 Iron deficiency anemia, unspecified: Secondary | ICD-10-CM | POA: Diagnosis not present

## 2018-11-13 MED ORDER — SODIUM CHLORIDE 0.9 % IV SOLN
510.0000 mg | Freq: Once | INTRAVENOUS | Status: AC
  Administered 2018-11-13: 510 mg via INTRAVENOUS
  Filled 2018-11-13: qty 17

## 2018-11-13 MED ORDER — SODIUM CHLORIDE 0.9 % IV SOLN
INTRAVENOUS | Status: DC | PRN
  Administered 2018-11-13: 250 mL via INTRAVENOUS

## 2018-11-13 NOTE — Progress Notes (Signed)
Patient Care Center   Diagnosis: Iron deficiency anemia   Procedure: Feraheme  Note:  Patient received Feraheme infusion,tolerated the full amount. Post 30 minutes vital signs stable. Discharge instructions provided,verbalized understanding. Alert, oriented and ambulatory at discharge.

## 2018-11-13 NOTE — Discharge Instructions (Signed)

## 2018-11-29 DIAGNOSIS — I10 Essential (primary) hypertension: Secondary | ICD-10-CM | POA: Diagnosis not present

## 2019-02-24 DIAGNOSIS — R0789 Other chest pain: Secondary | ICD-10-CM | POA: Diagnosis not present

## 2019-02-24 DIAGNOSIS — M940 Chondrocostal junction syndrome [Tietze]: Secondary | ICD-10-CM | POA: Diagnosis not present

## 2019-03-02 DIAGNOSIS — K047 Periapical abscess without sinus: Secondary | ICD-10-CM | POA: Diagnosis not present

## 2019-03-02 DIAGNOSIS — K0381 Cracked tooth: Secondary | ICD-10-CM | POA: Diagnosis not present

## 2019-03-04 DIAGNOSIS — I10 Essential (primary) hypertension: Secondary | ICD-10-CM | POA: Diagnosis not present

## 2019-03-04 DIAGNOSIS — D509 Iron deficiency anemia, unspecified: Secondary | ICD-10-CM | POA: Diagnosis not present

## 2020-09-04 ENCOUNTER — Ambulatory Visit (HOSPITAL_COMMUNITY)
Admission: EM | Admit: 2020-09-04 | Discharge: 2020-09-04 | Disposition: A | Discharge status: Home / Self Care | Payer: Commercial Managed Care - PPO | Attending: Family Medicine | Admitting: Family Medicine

## 2020-09-04 ENCOUNTER — Other Ambulatory Visit: Payer: Self-pay

## 2020-09-04 DIAGNOSIS — U071 COVID-19: Secondary | ICD-10-CM | POA: Diagnosis not present

## 2020-09-04 DIAGNOSIS — I10 Essential (primary) hypertension: Secondary | ICD-10-CM | POA: Diagnosis not present

## 2020-09-04 DIAGNOSIS — F17210 Nicotine dependence, cigarettes, uncomplicated: Secondary | ICD-10-CM | POA: Insufficient documentation

## 2020-09-04 DIAGNOSIS — J069 Acute upper respiratory infection, unspecified: Secondary | ICD-10-CM | POA: Diagnosis not present

## 2020-09-04 DIAGNOSIS — Z79899 Other long term (current) drug therapy: Secondary | ICD-10-CM | POA: Diagnosis not present

## 2020-09-04 DIAGNOSIS — R439 Unspecified disturbances of smell and taste: Secondary | ICD-10-CM | POA: Diagnosis present

## 2020-09-04 NOTE — ED Triage Notes (Signed)
Pt reports thursday she lost her taste and smell .  Pt here to day to have COVID test done ,because her frinds home COVID test was positive.

## 2020-09-04 NOTE — ED Provider Notes (Signed)
[facility]    CSN: [id number] Arrival date & time: 09/04/20  1236      History   Chief Complaint Chief Complaint  Patient presents with   URI    Loss of smell and taste    HPI [name] is a 34 y.o. female.   Here today for 3 days of sinus drainage, headache, loss of taste and smell, mild cough. Denies wheezing, CP, SOB, N/V/D. Taking theraflu with some relief. Wanting COVID testing as a friend of hers just tested positive on home COVID testing. Does smoke cigarettes, does not know of any pulmonary disease hx.      Past Medical History:  Diagnosis Date   Medical history non-contributory    No pertinent past medical history    Pregnancy induced hypertension     Patient Active Problem List   Diagnosis Date Noted   Furuncle of pubic region 03/09/2014   Indication for care in labor and delivery, antepartum 03/02/2014   Cesarean delivery delivered 03/02/2014   H/O: C-section 12/05/2013   History of pre-eclampsia 12/05/2013   Desires Tubal Ligation w/ Repeat C-section 12/05/2013   Abnormal quad screen 10/14/2013   Vitamin D deficiency 08/22/2013   Obesity 08/07/2013   Family history of diabetes mellitus (DM) 08/07/2013   Supervision of other normal pregnancy 08/07/2013    Past Surgical History:  Procedure Laterality Date   CESAREAN SECTION     CESAREAN SECTION WITH BILATERAL TUBAL LIGATION  03/02/2014   Procedure: Repeat CESAREAN SECTION WITH BILATERAL TUBAL LIGATION;  Surgeon: Antionette Char, MD;  Location: [facility];  Service: Obstetrics;;   DILATION AND CURETTAGE OF UTERUS     WISDOM TOOTH EXTRACTION      OB History    Gravida  6   Para  3   Term  3   Preterm      AB  3   Living  3     SAB  1   TAB  2   Ectopic      Multiple      Live Births  3            Home Medications    Prior to Admission medications   Medication Sig Start Date End Date Taking? Authorizing Provider  amLODipine (NORVASC) 5  MG tablet Take 1 tablet (5 mg total) by mouth daily. 07/08/18  Yes Little, Ambrose Finland, MD  ibuprofen (ADVIL,MOTRIN) 600 MG tablet Take 1 tablet (600 mg total) by mouth every 6 (six) hours as needed. 03/05/14  Yes Brock Bad, MD  albuterol (PROVENTIL HFA;VENTOLIN HFA) 108 (90 Base) MCG/ACT inhaler Inhale 2 puffs into the lungs every 4 (four) hours as needed for wheezing or shortness of breath (cough, shortness of breath or wheezing.). 12/21/17   Elvina Sidle, MD  ferrous sulfate 325 (65 FE) MG tablet Take 1 tablet (325 mg total) by mouth daily. 07/08/18 08/07/18  Little, Ambrose Finland, MD  HYDROcodone-homatropine (HYDROMET) 5-1.5 MG/5ML syrup Take 5 mLs by mouth every 6 (six) hours as needed for cough. 12/21/17   Elvina Sidle, MD  Prenatal Vit-FePoly-FA-DHA (VITAFOL-ONE) 29-1-200 MG CAPS Take 1 capsule by mouth daily. 09/07/13   Lanice Shirts, CNM  Spacer/Aero Chamber Mouthpiece MISC 1 Device by Does not apply route every 6 (six) hours as needed. 12/21/17   Elvina Sidle, MD    Family History Family History  Problem Relation Age of Onset   Diabetes Mother    Hypertension Father     Social  History Social History   Tobacco Use   Smoking status: Current Every Day Smoker    Packs/day: 0.50   Smokeless tobacco: Never Used  Vaping Use   Vaping Use: Never used  Substance Use Topics   Alcohol use: No   Drug use: No     Allergies   Patient has no known allergies.   Review of Systems Review of Systems PER HPI    Physical Exam Triage Vital Signs ED Triage Vitals  Enc Vitals Group     BP 09/04/20 1316 (!) 162/115     Pulse Rate 09/04/20 1316 96     Resp 09/04/20 1316 18     Temp 09/04/20 1316 98.9 F (37.2 C)     Temp Source 09/04/20 1316 Oral     SpO2 09/04/20 1316 100 %     Weight 09/04/20 1320 290 lb (131.5 kg)     Height 09/04/20 1320 5\' 6"  (1.676 m)     Head Circumference --      Peak Flow --      Pain Score 09/04/20 1319 0     Pain Loc --      Pain  Edu? --      Excl. in GC? --    No data found.  Updated Vital Signs BP (!) 162/115 (BP Location: Right Arm) Comment: pt did not take BP meds today.   Pulse 96    Temp 98.9 F (37.2 C) (Oral)    Resp 18    Ht 5\' 6"  (1.676 m)    Wt 290 lb (131.5 kg)    LMP 08/07/2020    SpO2 100%    BMI 46.81 kg/m   Visual Acuity Right Eye Distance:   Left Eye Distance:   Bilateral Distance:    Right Eye Near:   Left Eye Near:    Bilateral Near:     Physical Exam Vitals and nursing note reviewed.  Constitutional:      Appearance: Normal appearance. She is not ill-appearing.  HENT:     Head: Atraumatic.     Right Ear: Tympanic membrane normal.     Left Ear: Tympanic membrane normal.     Nose: Nose normal.     Mouth/Throat:     Mouth: Mucous membranes are moist.     Pharynx: Oropharynx is clear.  Eyes:     Extraocular Movements: Extraocular movements intact.     Conjunctiva/sclera: Conjunctivae normal.  Cardiovascular:     Rate and Rhythm: Normal rate and regular rhythm.     Heart sounds: Normal heart sounds.  Pulmonary:     Effort: Pulmonary effort is normal.     Breath sounds: Normal breath sounds.  Abdominal:     General: Bowel sounds are normal. There is no distension.     Palpations: Abdomen is soft.     Tenderness: There is no abdominal tenderness. There is no guarding.  Musculoskeletal:        General: Normal range of motion.     Cervical back: Normal range of motion and neck supple.  Skin:    General: Skin is warm and dry.  Neurological:     Mental Status: She is alert and oriented to person, place, and time.  Psychiatric:        Mood and Affect: Mood normal.        Thought Content: Thought content normal.        Judgment: Judgment normal.      UC Treatments / Results  Labs (all labs ordered are listed, but only abnormal results are displayed) Labs Reviewed  SARS CORONAVIRUS 2 (TAT 6-24 HRS)    EKG   Radiology No results found.  Procedures Procedures  (including critical care time)  Medications Ordered in UC Medications - No data to display  Initial Impression / Assessment and Plan / UC Course  I have reviewed the triage vital signs and the nursing notes.  Pertinent labs & imaging results that were available during my care of the patient were reviewed by me and considered in my medical decision making (see chart for details).     COVID pcr pending, isolation protocol reviewed while awaiting results and letter given. Discussed continued OTC supportive care and return precautions at length.  Final Clinical Impressions(s) / UC Diagnoses   Final diagnoses:  Viral URI with cough  Essential hypertension   Discharge Instructions   None    ED Prescriptions    None     PDMP not reviewed this encounter.   Particia Nearing, [place] 09/04/20 1422

## 2020-09-04 NOTE — ED Triage Notes (Signed)
PT reports she is not taking Norvasc but has med .

## 2020-09-05 ENCOUNTER — Telehealth (HOSPITAL_COMMUNITY): Payer: Self-pay

## 2020-09-05 LAB — SARS CORONAVIRUS 2 (TAT 6-24 HRS): SARS Coronavirus 2: POSITIVE — AB

## 2020-09-05 NOTE — Telephone Encounter (Signed)
Pt called to receive lab results °

## 2020-09-06 ENCOUNTER — Other Ambulatory Visit: Payer: Self-pay | Admitting: Unknown Physician Specialty

## 2020-09-06 ENCOUNTER — Telehealth: Payer: Self-pay | Admitting: Unknown Physician Specialty

## 2020-09-06 DIAGNOSIS — E6609 Other obesity due to excess calories: Secondary | ICD-10-CM

## 2020-09-06 DIAGNOSIS — U071 COVID-19: Secondary | ICD-10-CM

## 2020-09-06 NOTE — Telephone Encounter (Signed)
Called to Discuss with patient about Covid symptoms and the use of the monoclonal antibody infusion for those with mild to moderate Covid symptoms and at a high risk of hospitalization.     Pt appears to qualify for this infusion due to co-morbid conditions and/or a member of an at-risk group in accordance with the FDA Emergency Use Authorization.    Unable to reach pt    

## 2020-09-06 NOTE — Telephone Encounter (Signed)
I connected by phone with [name] on 09/06/2020 at 6:15 PM to discuss the potential use of a new treatment for mild to moderate COVID-19 viral infection in non-hospitalized patients.  This patient is a 34 y.o. female that meets the FDA criteria for Emergency Use Authorization of COVID monoclonal antibody casirivimab/imdevimab or bamlanivimab/eteseviamb.  Has a (+) direct SARS-CoV-2 viral test result  Has mild or moderate COVID-19   Is NOT hospitalized due to COVID-19  Is within 10 days of symptom onset  Has at least one of the high risk factor(s) for progression to severe COVID-19 and/or hospitalization as defined in EUA.  Specific high risk criteria : BMI > 25 and Cardiovascular disease or hypertension   I have spoken and communicated the following to the patient or parent/caregiver regarding COVID monoclonal antibody treatment:  1. FDA has authorized the emergency use for the treatment of mild to moderate COVID-19 in adults and pediatric patients with positive results of direct SARS-CoV-2 viral testing who are 93 years of age and older weighing at least 40 kg, and who are at high risk for progressing to severe COVID-19 and/or hospitalization.  2. The significant known and potential risks and benefits of COVID monoclonal antibody, and the extent to which such potential risks and benefits are unknown.  3. Information on available alternative treatments and the risks and benefits of those alternatives, including clinical trials.  4. Patients treated with COVID monoclonal antibody should continue to self-isolate and use infection control measures (e.g., wear mask, isolate, social distance, avoid sharing personal items, clean and disinfect "high touch" surfaces, and frequent handwashing) according to CDC guidelines.   5. The patient or parent/caregiver has the option to accept or refuse COVID monoclonal antibody treatment.  After reviewing this information with the patient, the  patient has agreed to receive one of the available covid 19 monoclonal antibodies and will be provided an appropriate fact sheet prior to infusion. Gabriel Cirri, NP 09/06/2020 6:15 PM  Sx onset 10/7

## 2020-09-07 ENCOUNTER — Ambulatory Visit (HOSPITAL_COMMUNITY)
Admission: RE | Admit: 2020-09-07 | Discharge: 2020-09-07 | Disposition: A | Discharge status: Home / Self Care | Payer: Commercial Managed Care - PPO | Source: Ambulatory Visit | Attending: Pulmonary Disease | Admitting: Pulmonary Disease

## 2020-09-07 DIAGNOSIS — U071 COVID-19: Secondary | ICD-10-CM | POA: Diagnosis not present

## 2020-09-07 DIAGNOSIS — E6609 Other obesity due to excess calories: Secondary | ICD-10-CM | POA: Insufficient documentation

## 2020-09-07 MED ORDER — DIPHENHYDRAMINE HCL 50 MG/ML IJ SOLN: 50.0000 mg | Freq: Once | INTRAMUSCULAR | Status: DC | PRN

## 2020-09-07 MED ORDER — SODIUM CHLORIDE 0.9 % IV SOLN
Freq: Once | INTRAVENOUS | Status: AC
  Filled 2020-09-07: qty 20

## 2020-09-07 MED ORDER — FAMOTIDINE IN NACL 20-0.9 MG/50ML-% IV SOLN: 20.0000 mg | Freq: Once | INTRAVENOUS | Status: DC | PRN

## 2020-09-07 MED ORDER — EPINEPHRINE 0.3 MG/0.3ML IJ SOAJ: 0.3000 mg | Freq: Once | INTRAMUSCULAR | Status: DC | PRN

## 2020-09-07 MED ORDER — SODIUM CHLORIDE 0.9 % IV SOLN: INTRAVENOUS | Status: DC | PRN

## 2020-09-07 MED ORDER — ALBUTEROL SULFATE HFA 108 (90 BASE) MCG/ACT IN AERS: 2.0000 | INHALATION_SPRAY | Freq: Once | RESPIRATORY_TRACT | Status: DC | PRN

## 2020-09-07 MED ORDER — METHYLPREDNISOLONE SODIUM SUCC 125 MG IJ SOLR: 125.0000 mg | Freq: Once | INTRAMUSCULAR | Status: DC | PRN

## 2020-09-07 NOTE — Progress Notes (Signed)
  Diagnosis: COVID-19  Physician: Dr. Patrick Wright Procedure: [facility] Med: bamlanivimab\etesevimab infusion - Provided patient with bamlanimivab\etesevimab fact sheet for patients, parents and caregivers prior to infusion.  Complications: No immediate complications noted.  Discharge: Discharged home   Ally Yow 09/07/2020   

## 2020-09-07 NOTE — Discharge Instructions (Signed)

## 2020-11-28 ENCOUNTER — Telehealth: Payer: Commercial Managed Care - PPO | Admitting: Family

## 2020-11-28 DIAGNOSIS — K047 Periapical abscess without sinus: Secondary | ICD-10-CM | POA: Diagnosis not present

## 2020-11-28 MED ORDER — AMOXICILLIN 500 MG PO CAPS: 500.0000 mg | ORAL_CAPSULE | Freq: Three times a day (TID) | ORAL | 0 refills | Status: DC

## 2020-11-28 NOTE — Progress Notes (Signed)
E-Visit for Dental Pain  We are sorry that you are not feeling well.  Here is how we plan to help!  Based on what you have shared with me in the questionnaire, it sounds like you have dental abscess.   Amoxicillin 500mg  3 times per day for 10 days.  It is imperative that you see a dentist within 10 days of this eVisit to determine the cause of the dental pain and be sure it is adequately treated  A toothache or tooth pain is caused when the nerve in the root of a tooth or surrounding a tooth is irritated. Dental (tooth) infection, decay, injury, or loss of a tooth are the most common causes of dental pain. Pain may also occur after an extraction (tooth is pulled out). Pain sometimes originates from other areas and radiates to the jaw, thus appearing to be tooth pain.Bacteria growing inside your mouth can contribute to gum disease and dental decay, both of which can cause pain. A toothache occurs from inflammation of the central portion of the tooth called pulp. The pulp contains nerve endings that are very sensitive to pain. Inflammation to the pulp or pulpitis may be caused by dental cavities, trauma, and infection.    HOME CARE:   For toothaches: . Over-the-counter pain medications such as acetaminophen or ibuprofen may be used. Take these as directed on the package while you arrange for a dental appointment. . Avoid very cold or hot foods, because they may make the pain worse. . You may get relief from biting on a cotton ball soaked in oil of cloves. You can get oil of cloves at most drug stores.  For jaw pain: .  Aspirin may be helpful for problems in the joint of the jaw in adults. . If pain happens every time you open your mouth widely, the temporomandibular joint (TMJ) may be the source of the pain. Yawning or taking a large bite of food may worsen the pain. An appointment with your doctor or dentist will help you find the cause.     GET HELP RIGHT AWAY IF:  . You have a high fever  or chills . If you have had a recent head or face injury and develop headache, light headedness, nausea, vomiting, or other symptoms that concern you after an injury to your face or mouth, you could have a more serious injury in addition to your dental injury. . A facial rash associated with a toothache: This condition may improve with medication. Contact your doctor for them to decide what is appropriate. . Any jaw pain occurring with chest pain: Although jaw pain is most commonly caused by dental disease, it is sometimes referred pain from other areas. People with heart disease, especially people who have had stents placed, people with diabetes, or those who have had heart surgery may have jaw pain as a symptom of heart attack or angina. If your jaw or tooth pain is associated with lightheadedness, sweating, or shortness of breath, you should see a doctor as soon as possible. . Trouble swallowing or excessive pain or bleeding from gums: If you have a history of a weakened immune system, diabetes, or steroid use, you may be more susceptible to infections. Infections can often be more severe and extensive or caused by unusual organisms. Dental and gum infections in people with these conditions may require more aggressive treatment. An abscess may need draining or IV antibiotics, for example.  MAKE SURE YOU    Understand these instructions.  Will watch your condition.  Will get help right away if you are not doing well or get worse.  Thank you for choosing an e-visit. Your e-visit answers were reviewed by a board certified advanced clinical practitioner to complete your personal care plan. Depending upon the condition, your plan could have included both over the counter or prescription medications. Please review your pharmacy choice. Make sure the pharmacy is open so you can pick up prescription now. If there is a problem, you may contact your provider through [employer] and have the  prescription routed to another pharmacy. Your safety is important to [place]. If you have drug allergies check your prescription carefully.  For the next 24 hours you can use MyChart to ask questions about today's visit, request a non-urgent call back, or ask for a work or school excuse. You will get an email in the next two days asking about your experience. I hope that your e-visit has been valuable and will speed your recovery.  Approximately 5 minutes was spent documenting and reviewing patient's chart.

## 2021-01-24 ENCOUNTER — Encounter (HOSPITAL_COMMUNITY): Payer: Self-pay

## 2021-01-24 ENCOUNTER — Ambulatory Visit (HOSPITAL_COMMUNITY)
Admission: EM | Admit: 2021-01-24 | Discharge: 2021-01-24 | Disposition: A | Discharge status: Home / Self Care | Payer: Commercial Managed Care - PPO

## 2021-01-24 ENCOUNTER — Other Ambulatory Visit: Payer: Self-pay

## 2021-01-24 DIAGNOSIS — J069 Acute upper respiratory infection, unspecified: Secondary | ICD-10-CM | POA: Diagnosis not present

## 2021-01-24 HISTORY — DX: Sleep apnea, unspecified: G47.30

## 2021-01-24 NOTE — ED Provider Notes (Signed)
[facility]    CSN: [id number] Arrival date & time: 01/24/21  1036      History   Chief Complaint Chief Complaint  Patient presents with  . Cough  . itchy throat & eyes  . Fatigue  . Generalized Body Aches    HPI [name] is a 35 y.o. female.   Patient reports cough, congestion, and sore throat for the past 3 days.  Patient reports cough is productive.    The history is provided by the patient.  Cough Cough characteristics:  Productive Severity:  Mild Onset quality:  Sudden Duration:  3 days Timing:  Constant Progression:  Unchanged Chronicity:  New Context: not animal exposure, not exposure to allergens, not fumes, not occupational exposure, not sick contacts, not smoke exposure, not upper respiratory infection, not weather changes and not with activity   Relieved by:  Decongestant and cough suppressants Worsened by:  Nothing Ineffective treatments:  None tried Associated symptoms: chills, fever, sinus congestion and sore throat   Associated symptoms: no ear pain and no headaches   Sore throat:    Severity:  Mild   Onset quality:  Sudden   Duration:  3 days   Timing:  Constant   Progression:  Unchanged Risk factors: no chemical exposure, no recent infection and no recent travel     Past Medical History:  Diagnosis Date  . Medical history non-contributory   . No pertinent past medical history   . Pregnancy induced hypertension   . Sleep apnea     Patient Active Problem List   Diagnosis Date Noted  . Furuncle of pubic region 03/09/2014  . Indication for care in labor and delivery, antepartum 03/02/2014  . Cesarean delivery delivered 03/02/2014  . H/O: C-section 12/05/2013  . History of pre-eclampsia 12/05/2013  . Desires Tubal Ligation w/ Repeat C-section 12/05/2013  . Abnormal quad screen 10/14/2013  . Vitamin D deficiency 08/22/2013  . Obesity 08/07/2013  . Family history of diabetes mellitus (DM) 08/07/2013  . Supervision of  other normal pregnancy 08/07/2013    Past Surgical History:  Procedure Laterality Date  . CESAREAN SECTION    . CESAREAN SECTION WITH BILATERAL TUBAL LIGATION  03/02/2014   Procedure: Repeat CESAREAN SECTION WITH BILATERAL TUBAL LIGATION;  Surgeon: Antionette Char, MD;  Location: [facility];  Service: Obstetrics;;  . DILATION AND CURETTAGE OF UTERUS    . WISDOM TOOTH EXTRACTION      OB History    Gravida  6   Para  3   Term  3   Preterm      AB  3   Living  3     SAB  1   IAB  2   Ectopic      Multiple      Live Births  3            Home Medications    Prior to Admission medications   Medication Sig Start Date End Date Taking? Authorizing Provider  albuterol (PROVENTIL HFA;VENTOLIN HFA) 108 (90 Base) MCG/ACT inhaler Inhale 2 puffs into the lungs every 4 (four) hours as needed for wheezing or shortness of breath (cough, shortness of breath or wheezing.). 12/21/17   Elvina Sidle, MD  amLODipine (NORVASC) 5 MG tablet Take 1 tablet (5 mg total) by mouth daily. 07/08/18   Little, Ambrose Finland, MD  amoxicillin (AMOXIL) 500 MG capsule Take 1 capsule (500 mg total) by mouth 3 (three) times daily. 11/28/20   Junie Spencer,  FNP  ferrous sulfate 325 (65 FE) MG tablet Take 1 tablet (325 mg total) by mouth daily. 07/08/18 08/07/18  Little, Ambrose Finland, MD  HYDROcodone-homatropine (HYDROMET) 5-1.5 MG/5ML syrup Take 5 mLs by mouth every 6 (six) hours as needed for cough. 12/21/17   Elvina Sidle, MD  ibuprofen (ADVIL,MOTRIN) 600 MG tablet Take 1 tablet (600 mg total) by mouth every 6 (six) hours as needed. 03/05/14   Brock Bad, MD  Prenatal Vit-FePoly-FA-DHA (VITAFOL-ONE) 29-1-200 MG CAPS Take 1 capsule by mouth daily. 09/07/13   Lanice Shirts, CNM  Spacer/Aero Chamber Mouthpiece MISC 1 Device by Does not apply route every 6 (six) hours as needed. 12/21/17   Elvina Sidle, MD  valsartan (DIOVAN) 160 MG tablet Take 160 mg by mouth daily. 01/11/21   [provider]    Family History Family History  Problem Relation Age of Onset  . Diabetes Mother   . Hypertension Father     Social History Social History   Tobacco Use  . Smoking status: Current Every Day Smoker    Packs/day: 0.50  . Smokeless tobacco: Never Used  Vaping Use  . Vaping Use: Never used  Substance Use Topics  . Alcohol use: No  . Drug use: No     Allergies   Patient has no known allergies.   Review of Systems Review of Systems  Constitutional: Positive for chills and fever.  HENT: Positive for congestion and sore throat. Negative for ear discharge, ear pain, facial swelling, sinus pressure, sinus pain and sneezing.   Eyes: Positive for itching.  Respiratory: Positive for cough.   Gastrointestinal: Negative for abdominal pain, nausea and vomiting.  Neurological: Negative for headaches.     Physical Exam Triage Vital Signs ED Triage Vitals  Enc Vitals Group     BP 01/24/21 1137 (!) 147/79     Pulse Rate 01/24/21 1136 89     Resp 01/24/21 1136 19     Temp 01/24/21 1136 98.1 F (36.7 C)     Temp src --      SpO2 01/24/21 1136 100 %     Weight --      Height --      Head Circumference --      Peak Flow --      Pain Score 01/24/21 1133 4     Pain Loc --      Pain Edu? --      Excl. in GC? --    No data found.  Updated Vital Signs BP (!) 147/79   Pulse 89   Temp 98.1 F (36.7 C)   Resp 19   LMP 01/14/2021 (Approximate)   SpO2 100%   Visual Acuity Right Eye Distance:   Left Eye Distance:   Bilateral Distance:    Right Eye Near:   Left Eye Near:    Bilateral Near:     Physical Exam Vitals and nursing note reviewed.  Constitutional:      General: She is not in acute distress.    Appearance: She is not ill-appearing or diaphoretic.  HENT:     Head: Normocephalic and atraumatic.     Right Ear: Tympanic membrane, ear canal and external ear normal.     Left Ear: Tympanic membrane, ear canal and external ear normal.     Nose: Congestion  present.     Mouth/Throat:     Mouth: Mucous membranes are moist.     Pharynx: Posterior oropharyngeal erythema present. No oropharyngeal exudate.  Tonsils: No tonsillar exudate or tonsillar abscesses. 1+ on the right. 1+ on the left.  Eyes:     Conjunctiva/sclera: Conjunctivae normal.     Pupils: Pupils are equal, round, and reactive to light.  Cardiovascular:     Rate and Rhythm: Normal rate.     Pulses: Normal pulses.     Heart sounds: Normal heart sounds.  Pulmonary:     Effort: Pulmonary effort is normal.     Breath sounds: Normal breath sounds.  Musculoskeletal:     Cervical back: Normal range of motion. No rigidity or tenderness.  Lymphadenopathy:     Cervical: No cervical adenopathy.  Neurological:     General: No focal deficit present.     Mental Status: She is alert and oriented to person, place, and time.  Psychiatric:        Mood and Affect: Mood normal.      UC Treatments / Results  Labs (all labs ordered are listed, but only abnormal results are displayed) Labs Reviewed - No data to display  EKG   Radiology No results found.  Procedures Procedures (including critical care time)  Medications Ordered in UC Medications - No data to display  Initial Impression / Assessment and Plan / UC Course  I have reviewed the triage vital signs and the nursing notes.  Pertinent labs & imaging results that were available during my care of the patient were reviewed by me and considered in my medical decision making (see chart for details).     Viral Illness.  Assessment negative for red flags or concerns.   Take Claritin or Zyrtec and use Flonase nasal spray 2 sprays in each nare once a day.  Tylenol and ibuprofen for fever reduction and pain relief.  Coricidin may be used for symptom management.  Encourage fluids and rest.   Final Clinical Impressions(s) / UC Diagnoses   Final diagnoses:  Viral upper respiratory illness     Discharge Instructions      You have a viral upper respiratory tract infection.   Rest and drink lots of fluids.    You may take Zyrtec/Claritin and Flonase (2 sprays in each nare).  You may take Tylenol and ibuprofen as needed for fever reduction and pain relief.   Coricidin is good for people with high blood pressure.    Return or go to the Emergency Department if symptoms worsen or do not improve in the next few days.      ED Prescriptions    None     PDMP not reviewed this encounter.   Ivette Loyal, NP 01/24/21 1200

## 2021-01-24 NOTE — Discharge Instructions (Signed)
You have a viral upper respiratory tract infection.   Rest and drink lots of fluids.    You may take Zyrtec/Claritin and Flonase (2 sprays in each nare).  You may take Tylenol and ibuprofen as needed for fever reduction and pain relief.   Coricidin is good for people with high blood pressure.    Return or go to the Emergency Department if symptoms worsen or do not improve in the next few days.

## 2021-01-24 NOTE — ED Triage Notes (Signed)
Pt in with c/o itchy throat eyes, chills, and productive cough that has been going on for a few days  Pt has been taking dayquil for sxs

## 2021-04-07 ENCOUNTER — Other Ambulatory Visit: Payer: Self-pay

## 2021-04-07 ENCOUNTER — Encounter (HOSPITAL_COMMUNITY): Payer: Self-pay | Admitting: Emergency Medicine

## 2021-04-07 ENCOUNTER — Ambulatory Visit (HOSPITAL_COMMUNITY)
Admission: EM | Admit: 2021-04-07 | Discharge: 2021-04-07 | Disposition: A | Discharge status: Home / Self Care | Payer: Commercial Managed Care - PPO | Attending: Student | Admitting: Student

## 2021-04-07 DIAGNOSIS — N200 Calculus of kidney: Secondary | ICD-10-CM

## 2021-04-07 HISTORY — DX: Anemia, unspecified: D64.9

## 2021-04-07 LAB — POCT URINALYSIS DIPSTICK, ED / UC
Bilirubin Urine: NEGATIVE
Glucose, UA: NEGATIVE mg/dL
Ketones, ur: NEGATIVE mg/dL
Leukocytes,Ua: NEGATIVE
Nitrite: NEGATIVE
Protein, ur: NEGATIVE mg/dL
Specific Gravity, Urine: 1.025 (ref 1.005–1.030)
Urobilinogen, UA: 0.2 mg/dL (ref 0.0–1.0)
pH: 6 (ref 5.0–8.0)

## 2021-04-07 LAB — POC URINE PREG, ED: Preg Test, Ur: NEGATIVE

## 2021-04-07 MED ORDER — TRAMADOL HCL 50 MG PO TABS: 50.0000 mg | ORAL_TABLET | Freq: Four times a day (QID) | ORAL | 0 refills | Status: DC | PRN

## 2021-04-07 MED ORDER — TAMSULOSIN HCL 0.4 MG PO CAPS: 0.4000 mg | ORAL_CAPSULE | Freq: Every day | ORAL | 0 refills | Status: AC

## 2021-04-07 NOTE — ED Triage Notes (Signed)
Onset of pain was Sunday or Monday.  Patient thought a back spasm, but pain is moving around torso to front of body.  Pain in right lower back, right flank and right hip.  Patient reports frequency with urination x 2 days

## 2021-04-07 NOTE — ED Provider Notes (Signed)
[facility]    CSN: [id number] Arrival date & time: 04/07/21  1749      History   Chief Complaint Chief Complaint  Patient presents with  . Abdominal Pain    HPI [name] is a 35 y.o. female presenting with urinary frequency and abdominal pain x3 days. Medical history pregnancy.  Notes 3 days of pain.  Started on her right flank, but has migrated to the right upper quadrant radiating to the right groin.  Also with 2 days of urinary frequency.  Denies other urinary symptoms including dysuria, hematuria, dark urine, malodorous urine, urgency, fever/chills. Bowel movements are regular. denies n/v/d/c. LMP 03/10/2021, states she could not be pregnant. No contraception.  Adamantly denies STI risk.  Denies vaginal discharge, vaginal symptoms.  HPI  Past Medical History:  Diagnosis Date  . Anemia   . Medical history non-contributory   . No pertinent past medical history   . Pregnancy induced hypertension   . Sleep apnea     Patient Active Problem List   Diagnosis Date Noted  . Furuncle of pubic region 03/09/2014  . Indication for care in labor and delivery, antepartum 03/02/2014  . Cesarean delivery delivered 03/02/2014  . H/O: C-section 12/05/2013  . History of pre-eclampsia 12/05/2013  . Desires Tubal Ligation w/ Repeat C-section 12/05/2013  . Abnormal quad screen 10/14/2013  . Vitamin D deficiency 08/22/2013  . Obesity 08/07/2013  . Family history of diabetes mellitus (DM) 08/07/2013  . Supervision of other normal pregnancy 08/07/2013    Past Surgical History:  Procedure Laterality Date  . CESAREAN SECTION    . CESAREAN SECTION WITH BILATERAL TUBAL LIGATION  03/02/2014   Procedure: Repeat CESAREAN SECTION WITH BILATERAL TUBAL LIGATION;  Surgeon: Antionette Char, MD;  Location: [facility];  Service: Obstetrics;;  . DILATION AND CURETTAGE OF UTERUS    . WISDOM TOOTH EXTRACTION      OB History    Gravida  6   Para  3   Term  3   Preterm      AB   3   Living  3     SAB  1   IAB  2   Ectopic      Multiple      Live Births  3            Home Medications    Prior to Admission medications   Medication Sig Start Date End Date Taking? Authorizing Provider  tamsulosin (FLOMAX) 0.4 MG CAPS capsule Take 1 capsule (0.4 mg total) by mouth daily for 7 days. 04/07/21 04/14/21 Yes Rhys Martini, PA-C  traMADol (ULTRAM) 50 MG tablet Take 1 tablet (50 mg total) by mouth every 6 (six) hours as needed. 04/07/21  Yes Rhys Martini, PA-C  albuterol (PROVENTIL HFA;VENTOLIN HFA) 108 (90 Base) MCG/ACT inhaler Inhale 2 puffs into the lungs every 4 (four) hours as needed for wheezing or shortness of breath (cough, shortness of breath or wheezing.). 12/21/17   Elvina Sidle, MD  ibuprofen (ADVIL,MOTRIN) 600 MG tablet Take 1 tablet (600 mg total) by mouth every 6 (six) hours as needed. 03/05/14   Brock Bad, MD  Spacer/Aero Chamber Mouthpiece MISC 1 Device by Does not apply route every 6 (six) hours as needed. 12/21/17   Elvina Sidle, MD  amLODipine (NORVASC) 5 MG tablet Take 1 tablet (5 mg total) by mouth daily. Patient not taking: Reported on 04/07/2021 07/08/18 04/07/21  Little, Ambrose Finland, MD  ferrous sulfate 325 (65  FE) MG tablet Take 1 tablet (325 mg total) by mouth daily. Patient not taking: Reported on 04/07/2021 07/08/18 04/07/21  Little, Ambrose Finland, MD  valsartan (DIOVAN) 160 MG tablet Take 160 mg by mouth daily. Patient not taking: Reported on 04/07/2021 01/11/21 04/07/21  [provider]    Family History Family History  Problem Relation Age of Onset  . Diabetes Mother   . Hypertension Father     Social History Social History   Tobacco Use  . Smoking status: Current Every Day Smoker    Packs/day: 0.50  . Smokeless tobacco: Never Used  Vaping Use  . Vaping Use: Never used  Substance Use Topics  . Alcohol use: Yes  . Drug use: No     Allergies   Patient has no known allergies.   Review of  Systems Review of Systems  Constitutional: Negative for appetite change, chills, diaphoresis and fever.  Respiratory: Negative for shortness of breath.   Cardiovascular: Negative for chest pain.  Gastrointestinal: Negative for abdominal pain, blood in stool, constipation, diarrhea, nausea and vomiting.  Genitourinary: Positive for flank pain and frequency. Negative for decreased urine volume, difficulty urinating, dysuria, genital sores, hematuria, menstrual problem, pelvic pain, urgency, vaginal bleeding, vaginal discharge and vaginal pain.  Musculoskeletal: Negative for back pain.  Neurological: Negative for dizziness, weakness and light-headedness.  All other systems reviewed and are negative.    Physical Exam Triage Vital Signs ED Triage Vitals [04/07/21 0830]  Enc Vitals Group     BP      Pulse      Resp      Temp      Temp src      SpO2      Weight      Height      Head Circumference      Peak Flow      Pain Score 9     Pain Loc      Pain Edu?      Excl. in GC?    No data found.  Updated Vital Signs BP 135/88 (BP Location: Left Arm) Comment (BP Location): large cuff  Pulse 81   Temp 98.8 F (37.1 C) (Oral)   Resp 20   LMP 03/10/2021   SpO2 97%   Visual Acuity Right Eye Distance:   Left Eye Distance:   Bilateral Distance:    Right Eye Near:   Left Eye Near:    Bilateral Near:     Physical Exam Vitals reviewed.  Constitutional:      General: She is not in acute distress.    Appearance: Normal appearance. She is not ill-appearing.  HENT:     Head: Normocephalic and atraumatic.     Mouth/Throat:     Mouth: Mucous membranes are moist.     Comments: Moist mucous membranes Eyes:     Extraocular Movements: Extraocular movements intact.     Pupils: Pupils are equal, round, and reactive to light.  Cardiovascular:     Rate and Rhythm: Normal rate and regular rhythm.     Heart sounds: Normal heart sounds.  Pulmonary:     Effort: Pulmonary effort is  normal.     Breath sounds: Normal breath sounds. No wheezing, rhonchi or rales.  Abdominal:     General: Bowel sounds are normal. There is no distension.     Palpations: Abdomen is soft. There is no mass.     Tenderness: There is abdominal tenderness in the right upper quadrant. There is  no right CVA tenderness, left CVA tenderness, guarding or rebound. Negative signs include Murphy's sign, Rovsing's sign and McBurney's sign.     Comments: RUQ pain to deep palpation. Negative murphy.  Skin:    General: Skin is warm.     Capillary Refill: Capillary refill takes less than 2 seconds.     Comments: Good skin turgor  Neurological:     General: No focal deficit present.     Mental Status: She is alert and oriented to person, place, and time.  Psychiatric:        Mood and Affect: Mood normal.        Behavior: Behavior normal.      UC Treatments / Results  Labs (all labs ordered are listed, but only abnormal results are displayed) Labs Reviewed  POCT URINALYSIS DIPSTICK, ED / UC - Abnormal; Notable for the following components:      Result Value   Hgb urine dipstick TRACE (*)    All other components within normal limits  POC URINE PREG, ED    EKG   Radiology No results found.  Procedures Procedures (including critical care time)  Medications Ordered in UC Medications - No data to display  Initial Impression / Assessment and Plan / UC Course  I have reviewed the triage vital signs and the nursing notes.  Pertinent labs & imaging results that were available during my care of the patient were reviewed by me and considered in my medical decision making (see chart for details).     This patient is a 35 year old female presenting with suspected nephrolithiasis. Today this pt is afebrile nontachycardic nontachypneic, oxygenating well on room air, no wheezes rhonchi or rales. Some RUQ tenderness to deep palpation but negative Murphy sign and no GI symptoms.  UA with trace blood,  otherwise wnl.  Urine pregnancy negative.  Plan to treat for nephrolithiasis with Flomax and tramadol as below.  Good hydration.  Strict ED return precautions discussed.  Final Clinical Impressions(s) / UC Diagnoses   Final diagnoses:  Nephrolithiasis     Discharge Instructions     -You have a kidney stone.  Kidney stones can typically pass on their own, but we will try to help this along with a few medications. -Flomax (tamsulosin) once daily.  This will help the stone pass.  You can stop taking this medication when your symptoms resolve, I provided enough for 7 days. -Tramadol for pain, up to every 6 hours.  This medication can cause drowsiness, so avoid driving and operating machinery after taking it. -You can take Tylenol and ibuprofen for additional relief. -Make sure to drink plenty of water to help the kidney stone pass. -If symptoms get worse instead of better, like worsening abdominal pain, back pain, any fever/chills, new burning when you urinate, etc.-had straight to the emergency department.  Some kidney stones are too big to pass on their own, so the emergency room can help you if this occurs.    ED Prescriptions    Medication Sig Dispense Auth. Provider   tamsulosin (FLOMAX) 0.4 MG CAPS capsule Take 1 capsule (0.4 mg total) by mouth daily for 7 days. 7 capsule Rhys Martini, PA-C   traMADol (ULTRAM) 50 MG tablet Take 1 tablet (50 mg total) by mouth every 6 (six) hours as needed. 15 tablet Rhys Martini, PA-C     I have reviewed the PDMP during this encounter.   Zlata, Alcaide, PA-C 04/07/21 [phone]

## 2021-04-07 NOTE — Discharge Instructions (Addendum)
-  You have a kidney stone.  Kidney stones can typically pass on their own, but we will try to help this along with a few medications. -Flomax (tamsulosin) once daily.  This will help the stone pass.  You can stop taking this medication when your symptoms resolve, I provided enough for 7 days. -Tramadol for pain, up to every 6 hours.  This medication can cause drowsiness, so avoid driving and operating machinery after taking it. -You can take Tylenol and ibuprofen for additional relief. -Make sure to drink plenty of water to help the kidney stone pass. -If symptoms get worse instead of better, like worsening abdominal pain, back pain, any fever/chills, new burning when you urinate, etc.-had straight to the emergency department.  Some kidney stones are too big to pass on their own, so the emergency room can help you if this occurs.

## 2021-12-27 ENCOUNTER — Encounter (HOSPITAL_COMMUNITY): Payer: Self-pay

## 2021-12-27 ENCOUNTER — Other Ambulatory Visit: Payer: Self-pay

## 2021-12-27 ENCOUNTER — Ambulatory Visit (HOSPITAL_COMMUNITY)
Admission: EM | Admit: 2021-12-27 | Discharge: 2021-12-27 | Disposition: A | Discharge status: Home / Self Care | Payer: Commercial Managed Care - PPO | Attending: Internal Medicine | Admitting: Internal Medicine

## 2021-12-27 DIAGNOSIS — M940 Chondrocostal junction syndrome [Tietze]: Secondary | ICD-10-CM

## 2021-12-27 NOTE — Discharge Instructions (Addendum)
Gentle range of motion exercises Heating pad use only 20 minutes on-20 minutes off cycle Please take ibuprofen as needed for pain Return to urgent care if symptoms worsen.

## 2021-12-27 NOTE — ED Triage Notes (Signed)
Pt presents with central chest pain that radiates to back since yesterday.  Pt also complains of tingling in both arms and hands since waking up this morning.

## 2021-12-29 NOTE — ED Provider Notes (Signed)
[facility]    CSN: [id number] Arrival date & time: 12/27/21  1050      History   Chief Complaint Chief Complaint  Patient presents with   Chest Pain   Tingling    HPI [name] is a 36 y.o. female comes to the urgent care with a 1 day history of central chest pain.  Patient describes the pain as sharp, moderate severity, aggravated by palpation over the chest with no known relieving factors.  Pain is not associated with any shortness of breath, cough or sputum production.  Pain is associated with some numbness and tingling in both hands patient denies any trauma to the chest.  No falls.  No fever or chills.  No cough or sputum production.  No long distance travel or calf pain.  No family history of early cardiac death.Marland Kitchen.   HPI  Past Medical History:  Diagnosis Date   Anemia    Medical history non-contributory    No pertinent past medical history    Pregnancy induced hypertension    Sleep apnea     Patient Active Problem List   Diagnosis Date Noted   Furuncle of pubic region 03/09/2014   Indication for care in labor and delivery, antepartum 03/02/2014   Cesarean delivery delivered 03/02/2014   H/O: C-section 12/05/2013   History of pre-eclampsia 12/05/2013   Desires Tubal Ligation w/ Repeat C-section 12/05/2013   Abnormal quad screen 10/14/2013   Vitamin D deficiency 08/22/2013   Obesity 08/07/2013   Family history of diabetes mellitus (DM) 08/07/2013   Supervision of other normal pregnancy 08/07/2013    Past Surgical History:  Procedure Laterality Date   CESAREAN SECTION     CESAREAN SECTION WITH BILATERAL TUBAL LIGATION  03/02/2014   Procedure: Repeat CESAREAN SECTION WITH BILATERAL TUBAL LIGATION;  Surgeon: Antionette CharLisa Jackson-Moore, MD;  Location: [facility];  Service: Obstetrics;;   DILATION AND CURETTAGE OF UTERUS     WISDOM TOOTH EXTRACTION      OB History     Gravida  6   Para  3   Term  3   Preterm      AB  3   Living  3      SAB  1    IAB  2   Ectopic      Multiple      Live Births  3            Home Medications    Prior to Admission medications   Medication Sig Start Date End Date Taking? Authorizing Provider  albuterol (PROVENTIL HFA;VENTOLIN HFA) 108 (90 Base) MCG/ACT inhaler Inhale 2 puffs into the lungs every 4 (four) hours as needed for wheezing or shortness of breath (cough, shortness of breath or wheezing.). 12/21/17   Elvina SidleLauenstein, Kurt, MD  ibuprofen (ADVIL,MOTRIN) 600 MG tablet Take 1 tablet (600 mg total) by mouth every 6 (six) hours as needed. 03/05/14   Brock BadHarper, Charles A, MD  Spacer/Aero Chamber Mouthpiece MISC 1 Device by Does not apply route every 6 (six) hours as needed. 12/21/17   Elvina SidleLauenstein, Kurt, MD  traMADol (ULTRAM) 50 MG tablet Take 1 tablet (50 mg total) by mouth every 6 (six) hours as needed. 04/07/21   Rhys MartiniGraham, Laura E, PA-C  amLODipine (NORVASC) 5 MG tablet Take 1 tablet (5 mg total) by mouth daily. Patient not taking: Reported on 04/07/2021 07/08/18 04/07/21  Little, Ambrose Finlandachel Morgan, MD  ferrous sulfate 325 (65 FE) MG tablet Take 1 tablet (325 mg total)  by mouth daily. Patient not taking: Reported on 04/07/2021 07/08/18 04/07/21  Little, Ambrose Finland, MD  valsartan (DIOVAN) 160 MG tablet Take 160 mg by mouth daily. Patient not taking: Reported on 04/07/2021 01/11/21 04/07/21  [provider]    Family History Family History  Problem Relation Age of Onset   Diabetes Mother    Hypertension Father     Social History Social History   Tobacco Use   Smoking status: Every Day    Packs/day: 0.50    Types: Cigarettes   Smokeless tobacco: Never  Vaping Use   Vaping Use: Never used  Substance Use Topics   Alcohol use: Yes   Drug use: No     Allergies   Patient has no known allergies.   Review of Systems Review of Systems  Respiratory:  Negative for cough, chest tightness and shortness of breath.   Cardiovascular:  Positive for chest pain.  Gastrointestinal: Negative.    Neurological: Negative.     Physical Exam Triage Vital Signs ED Triage Vitals  Enc Vitals Group     BP 12/27/21 1151 (!) 162/88     Pulse Rate 12/27/21 1148 88     Resp 12/27/21 1148 18     Temp 12/27/21 1148 98.9 F (37.2 C)     Temp Source 12/27/21 1148 Oral     SpO2 12/27/21 1148 100 %     Weight --      Height --      Head Circumference --      Peak Flow --      Pain Score 12/27/21 1147 9     Pain Loc --      Pain Edu? --      Excl. in GC? --    No data found.  Updated Vital Signs BP (!) 162/88    Pulse 88    Temp 98.9 F (37.2 C) (Oral)    Resp 18    LMP 12/09/2021    SpO2 100%   Visual Acuity Right Eye Distance:   Left Eye Distance:   Bilateral Distance:    Right Eye Near:   Left Eye Near:    Bilateral Near:     Physical Exam Vitals and nursing note reviewed.  Constitutional:      General: She is not in acute distress.    Appearance: She is not ill-appearing.  Cardiovascular:     Rate and Rhythm: Normal rate and regular rhythm.     Heart sounds: Normal heart sounds.  Pulmonary:     Effort: Pulmonary effort is normal.     Breath sounds: No decreased breath sounds, wheezing or rhonchi.  Chest:     Chest wall: Tenderness present.     Comments: Tenderness over the left costochondral joints. Neurological:     Mental Status: She is alert.     UC Treatments / Results  Labs (all labs ordered are listed, but only abnormal results are displayed) Labs Reviewed - No data to display  EKG   Radiology No results found.  Procedures Procedures (including critical care time)  Medications Ordered in UC Medications - No data to display  Initial Impression / Assessment and Plan / UC Course  I have reviewed the triage vital signs and the nursing notes.  Pertinent labs & imaging results that were available during my care of the patient were reviewed by me and considered in my medical decision making (see chart for details).     1.   Costochondritis: Gentle  range of motion exercises Ibuprofen as needed for pain Heating pad use only 20 minutes on-20 minutes off cycle recommended Return to urgent care if symptoms worsen. Final Clinical Impressions(s) / UC Diagnoses   Final diagnoses:  Costochondritis     Discharge Instructions      Gentle range of motion exercises Heating pad use only 20 minutes on-20 minutes off cycle Please take ibuprofen as needed for pain Return to urgent care if symptoms worsen.   ED Prescriptions   None    PDMP not reviewed this encounter.   Merrilee Jansky, MD 12/29/21 [phone]

## 2022-01-21 ENCOUNTER — Emergency Department (HOSPITAL_COMMUNITY)
Admission: EM | Admit: 2022-01-21 | Discharge: 2022-01-22 | Disposition: A | Discharge status: Home / Self Care | Payer: Self-pay | Attending: Emergency Medicine | Admitting: Emergency Medicine

## 2022-01-21 DIAGNOSIS — D649 Anemia, unspecified: Secondary | ICD-10-CM | POA: Insufficient documentation

## 2022-01-21 DIAGNOSIS — Z79899 Other long term (current) drug therapy: Secondary | ICD-10-CM | POA: Insufficient documentation

## 2022-01-21 DIAGNOSIS — I10 Essential (primary) hypertension: Secondary | ICD-10-CM | POA: Insufficient documentation

## 2022-01-21 LAB — CBC WITH DIFFERENTIAL/PLATELET
Abs Immature Granulocytes: 0.03 10*3/uL (ref 0.00–0.07)
Basophils Absolute: 0.1 10*3/uL (ref 0.0–0.1)
Basophils Relative: 1 %
Eosinophils Absolute: 0.2 10*3/uL (ref 0.0–0.5)
Eosinophils Relative: 3 %
HCT: 30.7 % — ABNORMAL LOW (ref 36.0–46.0)
Hemoglobin: 8.1 g/dL — ABNORMAL LOW (ref 12.0–15.0)
Immature Granulocytes: 0 %
Lymphocytes Relative: 37 %
Lymphs Abs: 3 10*3/uL (ref 0.7–4.0)
MCH: 18.2 pg — ABNORMAL LOW (ref 26.0–34.0)
MCHC: 26.4 g/dL — ABNORMAL LOW (ref 30.0–36.0)
MCV: 68.8 fL — ABNORMAL LOW (ref 80.0–100.0)
Monocytes Absolute: 0.7 10*3/uL (ref 0.1–1.0)
Monocytes Relative: 9 %
Neutro Abs: 4.2 10*3/uL (ref 1.7–7.7)
Neutrophils Relative %: 50 %
Platelets: 416 10*3/uL — ABNORMAL HIGH (ref 150–400)
RBC: 4.46 MIL/uL (ref 3.87–5.11)
RDW: 18.1 % — ABNORMAL HIGH (ref 11.5–15.5)
WBC: 8.2 10*3/uL (ref 4.0–10.5)
nRBC: 0 % (ref 0.0–0.2)

## 2022-01-21 LAB — I-STAT BETA HCG BLOOD, ED (MC, WL, AP ONLY): I-stat hCG, quantitative: <5 m[IU]/mL (ref <5)

## 2022-01-21 LAB — COMPREHENSIVE METABOLIC PANEL
ALT: 16 U/L (ref 0–44)
AST: 17 U/L (ref 15–41)
Albumin: 3.5 g/dL (ref 3.5–5.0)
Alkaline Phosphatase: 58 U/L (ref 38–126)
Anion gap: 6 (ref 5–15)
BUN: 10 mg/dL (ref 6–20)
CO2: 26 mmol/L (ref 22–32)
Calcium: 8.8 mg/dL — ABNORMAL LOW (ref 8.9–10.3)
Chloride: 103 mmol/L (ref 98–111)
Creatinine, Ser: 0.79 mg/dL (ref 0.44–1.00)
GFR, Estimated: >60 mL/min (ref >60)
Glucose, Bld: 120 mg/dL — ABNORMAL HIGH (ref 70–99)
Potassium: 3.5 mmol/L (ref 3.5–5.1)
Sodium: 135 mmol/L (ref 135–145)
Total Bilirubin: 0.3 mg/dL (ref 0.3–1.2)
Total Protein: 6.6 g/dL (ref 6.5–8.1)

## 2022-01-21 NOTE — ED Triage Notes (Signed)
Pt c/o "really high" BP, 212/104, states she felt "off." She drank apple cider vinegar, down to 180/103, then 164/104. Unable to get meds since December. Pt denies associated symptoms

## 2022-01-21 NOTE — ED Provider Triage Note (Signed)
Emergency Medicine Provider Triage Evaluation Note  [name] , a 36 y.o. female  was evaluated in triage.  Pt complains of hypertension.  Patient states that she just finished eating dinner and was sitting in a chair and felt "a sensation come over her".  Denies any headache, chest pain, shortness of breath, near syncope.  She states that she checked her blood pressure noted that was high so she came to the emergency department for evaluation.  States that she used to take losartan but due to insurance issues stopped taking in December of last year and has not restarted.  Physical Exam  BP (!) 157/99 (BP Location: Right Arm)    Pulse 92    Temp 98.5 F (36.9 C) (Oral)    Resp 20    SpO2 100%  Gen:   Awake, no distress   Resp:  Normal effort  MSK:   Moves extremities without difficulty  Other:    Medical Decision Making  Medically screening exam initiated at 11:14 PM.  Appropriate orders placed.  [name] was informed that the remainder of the evaluation will be completed by another provider, this initial triage assessment does not replace that evaluation, and the importance of remaining in the ED until their evaluation is complete.   Rayna Sexton, PA-C 01/21/22 2315

## 2022-01-22 ENCOUNTER — Other Ambulatory Visit: Payer: Self-pay

## 2022-01-22 ENCOUNTER — Emergency Department (HOSPITAL_COMMUNITY)
Admission: EM | Admit: 2022-01-22 | Discharge: 2022-01-23 | Discharge status: Against Medical Advice | Payer: Self-pay | Attending: Emergency Medicine | Admitting: Emergency Medicine

## 2022-01-22 ENCOUNTER — Encounter (HOSPITAL_COMMUNITY): Payer: Self-pay | Admitting: Emergency Medicine

## 2022-01-22 DIAGNOSIS — Z5321 Procedure and treatment not carried out due to patient leaving prior to being seen by health care provider: Secondary | ICD-10-CM | POA: Insufficient documentation

## 2022-01-22 DIAGNOSIS — I10 Essential (primary) hypertension: Secondary | ICD-10-CM | POA: Insufficient documentation

## 2022-01-22 MED ORDER — LOSARTAN POTASSIUM 25 MG PO TABS: 25.0000 mg | ORAL_TABLET | Freq: Every day | ORAL | 0 refills | Status: DC

## 2022-01-22 NOTE — Discharge Instructions (Addendum)
I am prescribing a medication called losartan.  This medication will help treat your high blood pressure.  Please take this once per day.  I have given you a 1 month supply.  Please follow-up with your regular doctor regarding this medication as well as your blood pressure.  Please follow-up with your regular doctor as well to have your hemoglobin rechecked.  You are anemic at this visit.  If you develop worsening fatigue, lightheadedness, or generally worsening symptoms, please come back to the emergency department immediately for reevaluation.

## 2022-01-22 NOTE — ED Triage Notes (Signed)
Pt reported to ED with c/o elevated blood pressure and headache. Pt states she was seen in this ED yesterday and prescribed medication for blood pressure. Pt states she checks BP q2hr, and knows that she have not given medication time to work but has been experiencing  headache. Pt educated by this RN that checking BP BID is a more appropriate interval and that readings should be recorded. Pt denies any other symptoms at this time.

## 2022-01-22 NOTE — ED Provider Notes (Signed)
[facility] EMERGENCY DEPARTMENT Provider Note   CSN: [id number] Arrival date & time: 01/21/22  2236     History  Chief Complaint  Patient presents with   Hypertension    [name] is a 36 y.o. female.  HPI Patient is a 36 year old female with a history of hypertension who presents to the emergency department due to high blood pressure.  Patient states that she finished eating dinner this evening and was sitting in a chair and felt a "sensation come over her".  She states that she felt as if her blood pressure was high so she checked it and found it to be 212/104.  She drank apple cider vinegar and then it came down to 180/103 and then subsequently 164/104 before coming to the emergency department.  She denies any headache, visual changes, numbness, weakness, chest pain, shortness of breath.  She states that she was previously on hypertension medications by due to insurance issues has not refilled them since December.    Home Medications Prior to Admission medications   Medication Sig Start Date End Date Taking? Authorizing Provider  losartan (COZAAR) 25 MG tablet Take 1 tablet (25 mg total) by mouth daily. 01/22/22  Yes Rayna Sexton, PA-C  albuterol (PROVENTIL HFA;VENTOLIN HFA) 108 (90 Base) MCG/ACT inhaler Inhale 2 puffs into the lungs every 4 (four) hours as needed for wheezing or shortness of breath (cough, shortness of breath or wheezing.). 12/21/17   Robyn Haber, MD  ibuprofen (ADVIL,MOTRIN) 600 MG tablet Take 1 tablet (600 mg total) by mouth every 6 (six) hours as needed. 03/05/14   Shelly Bombard, MD  Spacer/Aero Chamber Mouthpiece MISC 1 Device by Does not apply route every 6 (six) hours as needed. 12/21/17   Robyn Haber, MD  traMADol (ULTRAM) 50 MG tablet Take 1 tablet (50 mg total) by mouth every 6 (six) hours as needed. 04/07/21   Hazel Sams, PA-C  amLODipine (NORVASC) 5 MG tablet Take 1 tablet (5 mg total) by mouth daily. Patient not  taking: Reported on 04/07/2021 07/08/18 04/07/21  Little, Wenda Overland, MD  ferrous sulfate 325 (65 FE) MG tablet Take 1 tablet (325 mg total) by mouth daily. Patient not taking: Reported on 04/07/2021 07/08/18 04/07/21  Little, Wenda Overland, MD  valsartan (DIOVAN) 160 MG tablet Take 160 mg by mouth daily. Patient not taking: Reported on 04/07/2021 01/11/21 04/07/21  [provider]      Allergies    Patient has no known allergies.    Review of Systems   Review of Systems  All other systems reviewed and are negative. Ten systems reviewed and are negative for acute change, except as noted in the HPI.   Physical Exam Updated Vital Signs BP 134/68    Pulse 79    Temp 98.5 F (36.9 C) (Oral)    Resp (!) 23    SpO2 100%  Physical Exam Vitals and nursing note reviewed.  Constitutional:      General: She is not in acute distress.    Appearance: Normal appearance. She is not ill-appearing, toxic-appearing or diaphoretic.  HENT:     Head: Normocephalic and atraumatic.     Right Ear: External ear normal.     Left Ear: External ear normal.     Nose: Nose normal.     Mouth/Throat:     Mouth: Mucous membranes are moist.     Pharynx: Oropharynx is clear. No oropharyngeal exudate or posterior oropharyngeal erythema.  Eyes:  General: No scleral icterus.       Right eye: No discharge.        Left eye: No discharge.     Extraocular Movements: Extraocular movements intact.     Conjunctiva/sclera: Conjunctivae normal.  Cardiovascular:     Rate and Rhythm: Normal rate and regular rhythm.     Pulses: Normal pulses.     Heart sounds: Normal heart sounds. No murmur heard.   No friction rub. No gallop.  Pulmonary:     Effort: Pulmonary effort is normal. No respiratory distress.     Breath sounds: Normal breath sounds. No stridor. No wheezing, rhonchi or rales.  Abdominal:     General: Abdomen is flat.     Palpations: Abdomen is soft.     Tenderness: There is no abdominal tenderness.   Musculoskeletal:        General: Normal range of motion.     Cervical back: Normal range of motion and neck supple. No tenderness.  Skin:    General: Skin is warm and dry.  Neurological:     General: No focal deficit present.     Mental Status: She is alert and oriented to person, place, and time.     Comments: Patient is oriented to person, place, and time. Patient phonates in clear, complete, and coherent sentences.  Moving all 4 extremities with ease.  No gross deficits.  Psychiatric:        Mood and Affect: Mood normal.        Behavior: Behavior normal.    ED Results / Procedures / Treatments   Labs (all labs ordered are listed, but only abnormal results are displayed) Labs Reviewed  COMPREHENSIVE METABOLIC PANEL - Abnormal; Notable for the following components:      Result Value   Glucose, Bld 120 (*)    Calcium 8.8 (*)    All other components within normal limits  CBC WITH DIFFERENTIAL/PLATELET - Abnormal; Notable for the following components:   Hemoglobin 8.1 (*)    HCT 30.7 (*)    MCV 68.8 (*)    MCH 18.2 (*)    MCHC 26.4 (*)    RDW 18.1 (*)    Platelets 416 (*)    All other components within normal limits  I-STAT BETA HCG BLOOD, ED ([facility], [facility], [facility] ONLY)   EKG None  Radiology No results found.  Procedures Procedures   Medications Ordered in ED Medications - No data to display  ED Course/ Medical Decision Making/ A&P                           Medical Decision Making Amount and/or Complexity of Data Reviewed Labs: ordered.  Risk Prescription drug management.  Pt is a 36 y.o. female with a history of hypertension who presents to the emergency department due to high blood pressure.  Labs: CBC with a hemoglobin of 8.1, hematocrit of 30.7, MCV of 68.8, MCH of 18.2, MCHC of 26.4, RDW of 18.1, platelets of 416. CMP with a glucose of 120 and a calcium of 8.8. I-STAT beta-hCG less than 5.  I, Rayna Sexton, PA-C, personally reviewed and evaluated these  images and lab results as part of my medical decision-making.  On my exam patient's heart is regular rate and rhythm without murmurs, rubs, or gallops.  Lungs are clear to auscultation bilaterally.  Patient denies any physical complaints at this time.  No headache, visual changes, numbness, weakness, chest pain, shortness of  breath.  Patient A&O x3 and speaking clearly and coherently.  Normal kidney function on CMP.  Exam and lab work does not appear consistent with hypertensive emergency or hypertensive encephalopathy.  Patient initially hypertensive at 157/99 but her blood pressure appears to have lowered to 134/68 without intervention.  Patient incidentally found to have a hemoglobin of 8.1.  Appears to be similar to prior values in the past.  Patient reports a history of known anemia and states that she has a history of menorrhagia.  No shortness of breath, lightheadedness, or syncope.  Urged her to follow-up with her PCP to have her hemoglobin rechecked.  She verbalized understanding.  Feel that the patient is stable for discharge at this time and she is agreeable.  Will discharge on a course of losartan.  Urged patient to follow-up with her PCP to have her blood pressure rechecked as well as to further discuss her medication regimen.  Discussed return precautions.  Her questions were answered and she was amicable at the time of discharge.  Note: Portions of this report may have been transcribed using voice recognition software. Every effort was made to ensure accuracy; however, inadvertent computerized transcription errors may be present.   Final Clinical Impression(s) / ED Diagnoses Final diagnoses:  Hypertension, unspecified type  Anemia, unspecified type   Rx / DC Orders ED Discharge Orders          Ordered    losartan (COZAAR) 25 MG tablet  Daily        01/22/22 0131              Rayna Sexton, PA-C 01/22/22 0145    Mesner, Corene Cornea, MD 01/22/22 [phone]

## 2022-01-23 ENCOUNTER — Encounter (HOSPITAL_COMMUNITY): Payer: Self-pay | Admitting: Student

## 2022-01-23 NOTE — ED Notes (Signed)
Called for room x4 

## 2022-02-17 ENCOUNTER — Encounter: Payer: Self-pay | Admitting: Family

## 2022-02-17 ENCOUNTER — Ambulatory Visit (INDEPENDENT_AMBULATORY_CARE_PROVIDER_SITE_OTHER): Payer: Self-pay | Admitting: Family

## 2022-02-17 ENCOUNTER — Other Ambulatory Visit: Payer: Self-pay

## 2022-02-17 VITALS — BP 150/90 | HR 95 | Temp 97.2°F | Resp 18 | Ht 66.0 in | Wt 317.2 lb

## 2022-02-17 DIAGNOSIS — Z1159 Encounter for screening for other viral diseases: Secondary | ICD-10-CM

## 2022-02-17 DIAGNOSIS — I10 Essential (primary) hypertension: Secondary | ICD-10-CM

## 2022-02-17 DIAGNOSIS — F1091 Alcohol use, unspecified, in remission: Secondary | ICD-10-CM

## 2022-02-17 DIAGNOSIS — Z87891 Personal history of nicotine dependence: Secondary | ICD-10-CM

## 2022-02-17 DIAGNOSIS — Z6841 Body Mass Index (BMI) 40.0 and over, adult: Secondary | ICD-10-CM

## 2022-02-17 DIAGNOSIS — G4733 Obstructive sleep apnea (adult) (pediatric): Secondary | ICD-10-CM | POA: Insufficient documentation

## 2022-02-17 DIAGNOSIS — Z7689 Persons encountering health services in other specified circumstances: Secondary | ICD-10-CM

## 2022-02-17 MED ORDER — LOSARTAN POTASSIUM 50 MG PO TABS: 50.0000 mg | ORAL_TABLET | Freq: Every day | ORAL | 0 refills | Status: DC

## 2022-02-17 NOTE — Progress Notes (Signed)
? ?Provider: Richarda Blade FNP-C  ? ?Junetta Hearn, Donalee Citrin, NP ? ?Patient Care Team: ?Dhruvi Crenshaw, Donalee Citrin, NP as PCP - General (Family Medicine) ? ?Extended Emergency Contact Information ?Primary Emergency Contact: Canal,[name] ?Address: [address] ?         [place], [place] [address] [place] ?Mobile Phone: [phone] ?Relation: Spouse ? ?Code Status: Full code ?Goals of care: Advanced Directive information ? ?  02/17/2022  ?  9:00 AM  ?Advanced Directives  ?Does Patient Have a Medical Advance Directive? No  ?Would patient like information on creating a medical advance directive? No - Patient declined  ? ? ? ?Chief Complaint  ?Patient presents with  ? Establish Care  ?  New Patient.  ? ? ?HPI:  ?Pt is a 36 y.o. female seen today establish care here at [facility] for medical management of chronic diseases.Hypertension,Anemia,Obstructive sleep Apnea,Former cigarette smoker,alcohol use in remission  ? ?Hypertension -she was previously on hypertension medication but due to insurance issues has not refilled them since December.  She was seen in the emergency room on 01/21/2022 due to high blood pressure.  States she had finished eating her dinner in the evening and was sitting in a chair and fell a sensation that came over her home felt like her blood pressure was high.  When she checked her blood pressure she found it was 212/104.  So she drank apple cider vinegar and went to the emergency room blood pressure went down to 180/130.  In the ED blood pressure was 157/99.  Her hemoglobin was incidentally found to be 8.1 but was similar to prior levels.  She reported known anemia due to history of menorrhagia.  She was discharged on a course of losartan 25 mg tablet daily and advised to follow-up with PCP to have blood pressure rechecked. ?States used to take Valsartan with previous PCP.  ?denies any headache,dizziness,vision changes,fatigue,chest tightness,palpitation,chest pain or shortness  of breath.    ?States feels left chest tightness whenever she lays on the left side but once she is comfort it goes away.  ?Did a sleep study which shower severe OSA but insurance did not cover  ? ?Quit smoking In January,2023  ?Also Quit drinking alcohol in January,2023  ?Has also changed her eating habit.Eating lean meat,fruits and vegetables. ? ?Does not do much exercise because she works 7 days a week.Has tried to go to the GYM ? ?LMP 02/07/2022  ? ? ?Past Medical History:  ?Diagnosis Date  ? Anemia   ? Medical history non-contributory   ? No pertinent past medical history   ? Pregnancy induced hypertension   ? Sleep apnea   ? ?Past Surgical History:  ?Procedure Laterality Date  ? CESAREAN SECTION    ? CESAREAN SECTION WITH BILATERAL TUBAL LIGATION  03/02/2014  ? Procedure: Repeat CESAREAN SECTION WITH BILATERAL TUBAL LIGATION;  Surgeon: Antionette Char, MD;  Location: [facility];  Service: Obstetrics;;  ? DILATION AND CURETTAGE OF UTERUS    ? WISDOM TOOTH EXTRACTION    ? ? ?No Known Allergies ? ?Allergies as of 02/17/2022   ?No Known Allergies ?  ? ?  ?Medication List  ?  ? ?  ? Accurate as of February 17, 2022  9:02 AM. If you have any questions, ask your nurse or doctor.  ?  ?  ? ?  ? ?STOP taking these medications   ? ?albuterol 108 (90 Base) MCG/ACT inhaler ?Commonly known as: VENTOLIN HFA ?Stopped by: Donalee Citrin  Morgaine Kimball, NP ?  ?ibuprofen 600 MG tablet ?Commonly known as: ADVIL ?Stopped by: Caesar Bookmaninah C Zalika Tieszen, NP ?  ?Spacer/Aero Chamber Mouthpiece Misc ?Stopped by: Caesar Bookmaninah C Gurbani Figge, NP ?  ?traMADol 50 MG tablet ?Commonly known as: ULTRAM ?Stopped by: Caesar Bookmaninah C Fuad Forget, NP ?  ? ?  ? ?TAKE these medications   ? ?losartan 25 MG tablet ?Commonly known as: Cozaar ?Take 1 tablet (25 mg total) by mouth daily. ?  ? ?  ? ? ?Review of Systems  ?Constitutional:  Negative for appetite change, chills, fatigue, fever and unexpected weight change.  ?HENT:  Positive for dental problem. Negative for congestion, ear discharge, ear pain,  facial swelling, hearing loss, nosebleeds, postnasal drip, rhinorrhea, sinus pressure, sinus pain, sneezing, sore throat, tinnitus and trouble swallowing.   ?     Wears upper dentures  ?Eyes:  Negative for pain, discharge, redness, itching and visual disturbance.  ?Respiratory:  Negative for cough, chest tightness, shortness of breath and wheezing.   ?Cardiovascular:  Negative for chest pain, palpitations and leg swelling.  ?Gastrointestinal:  Negative for abdominal distention, abdominal pain, blood in stool, constipation, diarrhea, nausea and vomiting.  ?Endocrine: Negative for cold intolerance, heat intolerance, polydipsia, polyphagia and polyuria.  ?Genitourinary:  Negative for difficulty urinating, dysuria, flank pain, frequency and urgency.  ?Musculoskeletal:  Negative for arthralgias, back pain, gait problem, joint swelling, myalgias, neck pain and neck stiffness.  ?Skin:  Negative for color change, pallor, rash and wound.  ?Neurological:  Negative for dizziness, syncope, speech difficulty, weakness, light-headedness, numbness and headaches.  ?Hematological:  Does not bruise/bleed easily.  ?Psychiatric/Behavioral:  Negative for agitation, behavioral problems, confusion, hallucinations, self-injury, sleep disturbance and suicidal ideas. The patient is not nervous/anxious.   ? ?Immunization History  ?Administered Date(s) Administered  ? Influenza Inj Mdck Quad Pf 08/20/2018  ? ?Pertinent  Health Maintenance Due  ?Topic Date Due  ? PAP SMEAR-Modifier  08/07/2016  ? INFLUENZA VACCINE  06/27/2021  ? ? ?  04/07/2021  ?  8:35 AM 12/27/2021  ? 11:48 AM 01/21/2022  ? 11:05 PM 01/22/2022  ? 10:52 PM 02/17/2022  ?  8:59 AM  ?Fall Risk  ?Falls in the past year?     0  ?Was there an injury with Fall?     0  ?Fall Risk Category Calculator     0  ?Fall Risk Category     Low  ?Patient Fall Risk Level Low fall risk Low fall risk Low fall risk Low fall risk Low fall risk  ?Patient at Risk for Falls Due to     No Fall Risks  ?Fall  risk Follow up     Falls evaluation completed  ? ?Functional Status Survey: ?  ? ?Vitals:  ? 02/17/22 0856  ?BP: (!) 150/90  ?Pulse: 95  ?Resp: 18  ?Temp: (!) 97.2 ?F (36.2 ?C)  ?SpO2: 98%  ?Weight: (!) 317 lb 3.2 oz (143.9 kg)  ?Height: 5\' 6"  (1.676 m)  ? ?Body mass index is 51.2 kg/m?Marland Kitchen. ?Physical Exam ?Vitals reviewed.  ?Constitutional:   ?   General: She is not in acute distress. ?   Appearance: Normal appearance. She is morbidly obese. She is not ill-appearing or diaphoretic.  ?HENT:  ?   Head: Normocephalic.  ?   Right Ear: Tympanic membrane, ear canal and external ear normal. There is no impacted cerumen.  ?   Left Ear: Tympanic membrane, ear canal and external ear normal. There is no impacted cerumen.  ?   Nose: Nose  normal. No congestion or rhinorrhea.  ?   Mouth/Throat:  ?   Mouth: Mucous membranes are moist.  ?   Pharynx: Oropharynx is clear. No oropharyngeal exudate or posterior oropharyngeal erythema.  ?Eyes:  ?   General: No scleral icterus.    ?   Right eye: No discharge.     ?   Left eye: No discharge.  ?   Extraocular Movements: Extraocular movements intact.  ?   Conjunctiva/sclera: Conjunctivae normal.  ?   Pupils: Pupils are equal, round, and reactive to light.  ?Neck:  ?   Vascular: No carotid bruit.  ?Cardiovascular:  ?   Rate and Rhythm: Normal rate and regular rhythm.  ?   Pulses: Normal pulses.  ?   Heart sounds: Normal heart sounds. No murmur heard. ?  No friction rub. No gallop.  ?Pulmonary:  ?   Effort: Pulmonary effort is normal. No respiratory distress.  ?   Breath sounds: Normal breath sounds. No wheezing, rhonchi or rales.  ?Chest:  ?   Chest wall: No tenderness.  ?Abdominal:  ?   General: Bowel sounds are normal. There is no distension.  ?   Palpations: Abdomen is soft. There is no mass.  ?   Tenderness: There is no abdominal tenderness. There is no right CVA tenderness, left CVA tenderness, guarding or rebound.  ?Musculoskeletal:     ?   General: No swelling or tenderness. Normal range  of motion.  ?   Cervical back: Normal range of motion. No rigidity or tenderness.  ?   Right lower leg: No edema.  ?   Left lower leg: No edema.  ?Lymphadenopathy:  ?   Cervical: No cervical adenopathy.  ?S

## 2022-02-17 NOTE — Patient Instructions (Signed)
-   Advised to check Blood pressure at home and record on log provided and notify provider if B/p > 140/90   

## 2022-02-24 ENCOUNTER — Encounter: Payer: Self-pay | Admitting: Family

## 2022-02-24 ENCOUNTER — Ambulatory Visit (INDEPENDENT_AMBULATORY_CARE_PROVIDER_SITE_OTHER): Payer: Self-pay | Admitting: Family

## 2022-02-24 VITALS — BP 130/80 | HR 94 | Temp 97.1°F | Resp 18 | Ht 66.0 in | Wt 315.6 lb

## 2022-02-24 DIAGNOSIS — Z1159 Encounter for screening for other viral diseases: Secondary | ICD-10-CM

## 2022-02-24 DIAGNOSIS — I10 Essential (primary) hypertension: Secondary | ICD-10-CM

## 2022-02-24 DIAGNOSIS — R7989 Other specified abnormal findings of blood chemistry: Secondary | ICD-10-CM

## 2022-02-24 NOTE — Patient Instructions (Signed)
-   check Blood pressure at home and record on log provided and notify provider if B/p > 140/90 send readings of blood pressure on MYchart in 2 weeks.  ? ?

## 2022-02-24 NOTE — Progress Notes (Signed)
? ?Provider: Marlowe Sax FNP-C ? ?Buel Molder, Nelda Bucks, NP ? ?Patient Care Team: ?Cricket Goodlin, Nelda Bucks, NP as PCP - General (Family Medicine) ? ?Extended Emergency Contact Information ?Primary Emergency Contact: Christian,[name] ?Address: [address] ?         [place], [place] [address] [place] ?Mobile Phone: [phone] ?Relation: Spouse ? ?Code Status:  Full Code  ?Goals of care: Advanced Directive information ? ?  02/24/2022  ?  9:32 AM  ?Advanced Directives  ?Does Patient Have a Medical Advance Directive? No  ?Would patient like information on creating a medical advance directive? No - Patient declined  ? ? ? ?Chief Complaint  ?Patient presents with  ? Follow-up  ?  1 week follow up on blood pressure and fasting labs.  ? ? ?HPI:  ?Pt is a 36 y.o. female seen today for an acute visit for 1 week for blood pressure follow-up and fasting blood work.  She was here on 02/17/2022 to establish care her blood pressure was 150/90 without any symptoms.  She was status post ED visit in February 2023 for uncontrolled hypertension she was started on losartan 25 mg daily and was advised to establish care with PCP.  She had completed losartan as prescribed in the ED.Losartan was increased to 50 mg daily on previous visit and advised to follow-up today.She denies any headache,dizziness,vision changes,fatigue,chest tightness,palpitation,chest pain or shortness of breath.Blood pressure is down to normal range this visit.She denies any acute issues.    ? ? ? ?Past Medical History:  ?Diagnosis Date  ? Alcohol use disorder in remission   ? Anemia   ? Former cigarette smoker   ? Hypertension   ? Pregnancy induced hypertension   ? Sleep apnea   ? ?Past Surgical History:  ?Procedure Laterality Date  ? CESAREAN SECTION    ? CESAREAN SECTION WITH BILATERAL TUBAL LIGATION  03/02/2014  ? Procedure: Repeat CESAREAN SECTION WITH BILATERAL TUBAL LIGATION;  Surgeon: Lahoma Crocker, MD;  Location: [facility];  Service: Obstetrics;;  ?  DILATION AND CURETTAGE OF UTERUS    ? WISDOM TOOTH EXTRACTION    ? ? ?No Known Allergies ? ?Outpatient Encounter Medications as of 02/24/2022  ?Medication Sig  ? losartan (COZAAR) 50 MG tablet Take 1 tablet (50 mg total) by mouth daily.  ? [DISCONTINUED] amLODipine (NORVASC) 5 MG tablet Take 1 tablet (5 mg total) by mouth daily. (Patient not taking: Reported on 04/07/2021)  ? [DISCONTINUED] ferrous sulfate 325 (65 FE) MG tablet Take 1 tablet (325 mg total) by mouth daily. (Patient not taking: Reported on 04/07/2021)  ? [DISCONTINUED] valsartan (DIOVAN) 160 MG tablet Take 160 mg by mouth daily. (Patient not taking: Reported on 04/07/2021)  ? ?No facility-administered encounter medications on file as of 02/24/2022.  ? ? ?Review of Systems  ?Constitutional:  Negative for appetite change, chills, fatigue, fever and unexpected weight change.  ?Eyes:  Negative for pain, discharge, redness, itching and visual disturbance.  ?Respiratory:  Negative for cough, chest tightness, shortness of breath and wheezing.   ?Cardiovascular:  Negative for chest pain, palpitations and leg swelling.  ?Gastrointestinal:  Negative for abdominal distention, abdominal pain, blood in stool, constipation, diarrhea, nausea and vomiting.  ?Skin:  Negative for color change, pallor and rash.  ?Neurological:  Negative for dizziness, speech difficulty, weakness, light-headedness, numbness and headaches.  ? ?Immunization History  ?Administered Date(s) Administered  ? Influenza Inj Mdck Quad Pf 08/20/2018  ? PFIZER(Purple Top)SARS-COV-2 Vaccination 02/05/2020, 02/26/2020  ? ?Pertinent  Health Maintenance Due  ?  Topic Date Due  ? PAP SMEAR-Modifier  08/07/2016  ? INFLUENZA VACCINE  07/28/2022 (Originally 06/27/2021)  ? ? ?  12/27/2021  ? 11:48 AM 01/21/2022  ? 11:05 PM 01/22/2022  ? 10:52 PM 02/17/2022  ?  8:59 AM 02/24/2022  ?  9:31 AM  ?Fall Risk  ?Falls in the past year?    0 0  ?Was there an injury with Fall?    0 0  ?Fall Risk Category Calculator    0 0  ?Fall  Risk Category    Low Low  ?Patient Fall Risk Level _0   ?Patient at Risk for Falls Due to    No Fall Risks No Fall Risks  ?Fall risk Follow up    Falls evaluation completed Falls evaluation completed  ? ?Functional Status Survey: ?  ? ?Vitals:  ? 02/24/22 0943  ?BP: 130/80  ?Pulse: 94  ?Resp: 18  ?Temp: (!) 97.1 ?F (36.2 ?C)  ?SpO2: 97%  ?Weight: (!) 315 lb 9.6 oz (143.2 kg)  ?Height: _1  (1.676 m)  ? ?Body mass index is 50.94 kg/m?Marland Kitchen ?Physical Exam ?Vitals reviewed.  ?Constitutional:   ?   General: She is not in acute distress. ?   Appearance: Normal appearance. She is normal weight. She is not ill-appearing or diaphoretic.  ?HENT:  ?   Head: Normocephalic.  ?Eyes:  ?   General: No scleral icterus.    ?   Right eye: No discharge.     ?   Left eye: No discharge.  ?   Conjunctiva/sclera: Conjunctivae normal.  ?   Pupils: Pupils are equal, round, and reactive to light.  ?Neck:  ?   Vascular: No carotid bruit.  ?Cardiovascular:  ?   Rate and Rhythm: Normal rate and regular rhythm.  ?   Pulses: Normal pulses.  ?   Heart sounds: Normal heart sounds. No murmur heard. ?  No friction rub. No gallop.  ?Pulmonary:  ?   Effort: Pulmonary effort is normal. No respiratory distress.  ?   Breath sounds: Normal breath sounds. No wheezing, rhonchi or rales.  ?Chest:  ?   Chest wall: No tenderness.  ?Abdominal:  ?   General: Bowel sounds are normal. There is no distension.  ?   Palpations: Abdomen is soft. There is no mass.  ?   Tenderness: There is no abdominal tenderness. There is no right CVA tenderness, left CVA tenderness, guarding or rebound.  ?Musculoskeletal:     ?   General: No swelling or tenderness. Normal range of motion.  ?   Cervical back: Normal range of motion. No tenderness.  ?   Right lower leg: No edema.  ?   Left lower leg: No edema.  ?Lymphadenopathy:  ?   Cervical: No cervical adenopathy.  ?Skin: ?   General: Skin is warm and dry.  ?    Coloration: Skin is not pale.  ?   Findings: No bruising, erythema, lesion or rash.  ?Neurological:  ?   Mental Status: She is alert and oriented to person, place, and time.  ?   Cranial Nerves: No cranial nerve deficit.  ?   Motor: No weakness.  ?   Gait: Gait normal.  ?Psychiatric:     ?   Mood and Affect: Mood normal.     ?   Speech: Speech normal.     ?   Behavior: Behavior normal.  ? ? ?  Labs reviewed: ?Recent Labs  ?  01/21/22 ?2324  ?NA 135  ?K 3.5  ?CL 103  ?CO2 26  ?GLUCOSE 120*  ?BUN 10  ?CREATININE 0.79  ?CALCIUM 8.8*  ? ?Recent Labs  ?  01/21/22 ?2324  ?AST 17  ?ALT 16  ?ALKPHOS 58  ?BILITOT 0.3  ?PROT 6.6  ?ALBUMIN 3.5  ? ?Recent Labs  ?  01/21/22 ?2324  ?WBC 8.2  ?NEUTROABS 4.2  ?HGB 8.1*  ?HCT 30.7*  ?MCV 68.8*  ?PLT 416*  ? ?No results found for: TSH ?No results found for: HGBA1C ?No results found for: CHOL, HDL, LDLCALC, LDLDIRECT, TRIG, CHOLHDL ? ?Significant Diagnostic Results in last 30 days:  ?No results found. ? ?Assessment/Plan ? ?1. Uncontrolled hypertension ?Blood pressure has improved this visit.  We will continue with losartan 50 mg tablet daily ?-Advised to monitor blood pressure at home if it is greater than 140/90 to notify provider send readings of blood pressure on MYchart in 2 weeks.  ?-Dietary modification and exercise advised ?- Lipid panel ?- TSH ?- CMP with eGFR([facility]) ?- CBC with Differential/Platelet ? ?2. Encounter for hepatitis C screening test for low risk patient ?Low risk ?- Hep C Antibody ? ?Family/ staff Communication: Reviewed plan of care with patient verbalized understanding ? ?Labs/tests ordered:  ?- CBC with Differential/Platelet ?- CMP with eGFR([facility]) ?- TSH ?- Lipid panel ?- Hep C Antibody ? ?Next Appointment: 4 months for medical management of chronic issues. ? ?Sandrea Hughs, NP ?

## 2022-02-28 LAB — CBC WITH DIFFERENTIAL/PLATELET
Absolute Monocytes: 382 cells/uL (ref 200–950)
Basophils Absolute: 51 cells/uL (ref 0–200)
Basophils Relative: 1.1 %
Eosinophils Absolute: 120 cells/uL (ref 15–500)
Eosinophils Relative: 2.6 %
HCT: 32.1 % — ABNORMAL LOW (ref 35.0–45.0)
Hemoglobin: 8.8 g/dL — ABNORMAL LOW (ref 11.7–15.5)
Lymphs Abs: 2203 cells/uL (ref 850–3900)
MCH: 18.4 pg — ABNORMAL LOW (ref 27.0–33.0)
MCHC: 27.4 g/dL — ABNORMAL LOW (ref 32.0–36.0)
MCV: 67.2 fL — ABNORMAL LOW (ref 80.0–100.0)
MPV: 9.2 fL (ref 7.5–12.5)
Monocytes Relative: 8.3 %
Neutro Abs: 1845 cells/uL (ref 1500–7800)
Neutrophils Relative %: 40.1 %
Platelets: 392 10*3/uL (ref 140–400)
RBC: 4.78 10*6/uL (ref 3.80–5.10)
RDW: 17.2 % — ABNORMAL HIGH (ref 11.0–15.0)
Total Lymphocyte: 47.9 %
WBC: 4.6 10*3/uL (ref 3.8–10.8)

## 2022-02-28 LAB — COMPLETE METABOLIC PANEL WITH GFR
AG Ratio: 1.5 (calc) (ref 1.0–2.5)
ALT: 10 U/L (ref 6–29)
AST: 11 U/L (ref 10–30)
Albumin: 4 g/dL (ref 3.6–5.1)
Alkaline phosphatase (APISO): 56 U/L (ref 31–125)
BUN: 11 mg/dL (ref 7–25)
CO2: 24 mmol/L (ref 20–32)
Calcium: 9 mg/dL (ref 8.6–10.2)
Chloride: 107 mmol/L (ref 98–110)
Creat: 0.82 mg/dL (ref 0.50–0.97)
Globulin: 2.6 g/dL (calc) (ref 1.9–3.7)
Glucose, Bld: 94 mg/dL (ref 65–99)
Potassium: 4.4 mmol/L (ref 3.5–5.3)
Sodium: 138 mmol/L (ref 135–146)
Total Bilirubin: 0.3 mg/dL (ref 0.2–1.2)
Total Protein: 6.6 g/dL (ref 6.1–8.1)
eGFR: 94 mL/min/{1.73_m2} (ref >=60)

## 2022-02-28 LAB — LIPID PANEL
Cholesterol: 224 mg/dL — ABNORMAL HIGH (ref <200)
HDL: 52 mg/dL (ref >=50)
LDL Cholesterol (Calc): 152 mg/dL (calc) — ABNORMAL HIGH
Non-HDL Cholesterol (Calc): 172 mg/dL (calc) — ABNORMAL HIGH (ref <130)
Total CHOL/HDL Ratio: 4.3 (calc) (ref <5.0)
Triglycerides: 93 mg/dL (ref <150)

## 2022-02-28 LAB — T4, FREE: Free T4: 0.9 ng/dL (ref 0.8–1.8)

## 2022-02-28 LAB — CBC MORPHOLOGY

## 2022-02-28 LAB — TEST AUTHORIZATION

## 2022-02-28 LAB — HEPATITIS C ANTIBODY
Hepatitis C Ab: NONREACTIVE
SIGNAL TO CUT-OFF: 0.06 (ref <1.00)

## 2022-02-28 LAB — T3, FREE: T3, Free: 3 pg/mL (ref 2.3–4.2)

## 2022-02-28 LAB — TSH: TSH: 5.29 mIU/L — ABNORMAL HIGH

## 2022-03-07 ENCOUNTER — Other Ambulatory Visit: Payer: Self-pay

## 2022-03-09 ENCOUNTER — Emergency Department (HOSPITAL_COMMUNITY)
Admission: EM | Admit: 2022-03-09 | Discharge: 2022-03-10 | Disposition: A | Discharge status: Home / Self Care | Payer: Self-pay | Attending: Emergency Medicine | Admitting: Emergency Medicine

## 2022-03-09 ENCOUNTER — Encounter (HOSPITAL_COMMUNITY): Payer: Self-pay | Admitting: *Deleted

## 2022-03-09 ENCOUNTER — Other Ambulatory Visit: Payer: Self-pay

## 2022-03-09 DIAGNOSIS — D509 Iron deficiency anemia, unspecified: Secondary | ICD-10-CM | POA: Insufficient documentation

## 2022-03-09 DIAGNOSIS — Z79899 Other long term (current) drug therapy: Secondary | ICD-10-CM | POA: Insufficient documentation

## 2022-03-09 DIAGNOSIS — I10 Essential (primary) hypertension: Secondary | ICD-10-CM | POA: Insufficient documentation

## 2022-03-09 DIAGNOSIS — R42 Dizziness and giddiness: Secondary | ICD-10-CM

## 2022-03-09 LAB — CBC
HCT: 32 % — ABNORMAL LOW (ref 36.0–46.0)
Hemoglobin: 8.6 g/dL — ABNORMAL LOW (ref 12.0–15.0)
MCH: 18.2 pg — ABNORMAL LOW (ref 26.0–34.0)
MCHC: 26.9 g/dL — ABNORMAL LOW (ref 30.0–36.0)
MCV: 67.8 fL — ABNORMAL LOW (ref 80.0–100.0)
Platelets: 463 10*3/uL — ABNORMAL HIGH (ref 150–400)
RBC: 4.72 MIL/uL (ref 3.87–5.11)
RDW: 18.6 % — ABNORMAL HIGH (ref 11.5–15.5)
WBC: 4.4 10*3/uL (ref 4.0–10.5)
nRBC: 0 % (ref 0.0–0.2)

## 2022-03-09 LAB — BASIC METABOLIC PANEL
Anion gap: 6 (ref 5–15)
BUN: 8 mg/dL (ref 6–20)
CO2: 24 mmol/L (ref 22–32)
Calcium: 8.6 mg/dL — ABNORMAL LOW (ref 8.9–10.3)
Chloride: 108 mmol/L (ref 98–111)
Creatinine, Ser: 0.78 mg/dL (ref 0.44–1.00)
GFR, Estimated: >60 mL/min (ref >60)
Glucose, Bld: 116 mg/dL — ABNORMAL HIGH (ref 70–99)
Potassium: 3.5 mmol/L (ref 3.5–5.1)
Sodium: 138 mmol/L (ref 135–145)

## 2022-03-09 LAB — I-STAT BETA HCG BLOOD, ED (MC, WL, AP ONLY): I-stat hCG, quantitative: <5 m[IU]/mL (ref <5)

## 2022-03-09 MED ORDER — MECLIZINE HCL 25 MG PO TABS: 25.0000 mg | ORAL_TABLET | Freq: Once | ORAL | Status: DC

## 2022-03-09 MED ORDER — MECLIZINE HCL 25 MG PO TABS
25.0000 mg | ORAL_TABLET | Freq: Once | ORAL | Status: AC
  Administered 2022-03-09: 25 mg via ORAL
  Filled 2022-03-09: qty 1

## 2022-03-09 NOTE — ED Provider Notes (Signed)
?[facility] EMERGENCY DEPARTMENT ?Provider Note ? ? ?CSN: [id number] ?Arrival date & time: 03/09/22  1028 ? ?  ? ?History ? ?Chief Complaint  ?Patient presents with  ? Weakness  ? Dizziness  ? ? ?[name] is a 36 y.o. female. ? ?Patient with history of hypertension, hyperlipidemia, and morbid obesity presents today with complaints of dizziness.  She states that same began this morning when she went to stand up to get out of bed.  She describes the feeling as being intoxicated, however she denies any alcohol use today.  She states that the feeling has been there throughout the day remotely, however is worse with head movements and with standing up and sitting down.  She denies any history of similar events.  She denies any headaches, blurred vision, fevers, chills, nausea, or vomiting.  She does have a history of anemia and is been on her menstrual cycle for the past few days.  States that this cycles bleeding has been light for her. She denies any falls and states that she is able to ambulate with some difficulty. ? ?The history is provided by the patient. No language interpreter was used.  ?Weakness ?Associated symptoms: dizziness   ?Associated symptoms: no fever, no headaches, no nausea, no seizures and no vomiting   ?Dizziness ?Associated symptoms: no headaches, no nausea, no vomiting and no weakness   ? ?  ? ?Home Medications ?Prior to Admission medications   ?Medication Sig Start Date End Date Taking? Authorizing Provider  ?losartan (COZAAR) 50 MG tablet Take 1 tablet (50 mg total) by mouth daily. 02/17/22 05/18/22 Yes Ngetich, Dinah C, NP  ?docusate sodium (COLACE) 100 MG capsule Take 1 capsule (100 mg total) by mouth daily. 03/07/22   Ngetich, Donalee Citrin, NP  ?Iron, Ferrous Sulfate, 325 (65 Fe) MG TABS Take 325 mg by mouth daily. 03/07/22   Ngetich, Dinah C, NP  ?amLODipine (NORVASC) 5 MG tablet Take 1 tablet (5 mg total) by mouth daily. ?Patient not taking: Reported on 04/07/2021 07/08/18  04/07/21  Little, Ambrose Finland, MD  ?valsartan (DIOVAN) 160 MG tablet Take 160 mg by mouth daily. ?Patient not taking: Reported on 04/07/2021 01/11/21 04/07/21  [provider]  ?   ? ?Allergies    ?Patient has no known allergies.   ? ?Review of Systems   ?Review of Systems  ?Constitutional:  Negative for chills and fever.  ?Gastrointestinal:  Negative for nausea and vomiting.  ?Allergic/Immunologic: Negative for immunocompromised state.  ?Neurological:  Positive for dizziness. Negative for tremors, seizures, syncope, facial asymmetry, speech difficulty, weakness, light-headedness, numbness and headaches.  ?Hematological:  Does not bruise/bleed easily.  ?Psychiatric/Behavioral:  Negative for confusion and decreased concentration.   ?All other systems reviewed and are negative. ? ?Physical Exam ?Updated Vital Signs ?BP (!) 144/86 (BP Location: Left Arm)   Pulse 74   Temp 98.1 ?F (36.7 ?C) (Oral)   Resp 18   LMP 03/09/2022   SpO2 100%  ?Physical Exam ?Vitals and nursing note reviewed.  ?Constitutional:   ?   General: She is not in acute distress. ?   Appearance: Normal appearance. She is normal weight. She is not ill-appearing, toxic-appearing or diaphoretic.  ?HENT:  ?   Head: Normocephalic and atraumatic.  ?   Right Ear: Tympanic membrane, ear canal and external ear normal.  ?   Left Ear: Tympanic membrane, ear canal and external ear normal.  ?Eyes:  ?   Extraocular Movements: Extraocular movements intact.  ?  Pupils: Pupils are equal, round, and reactive to light.  ?   Comments: No nystagmus  ?Cardiovascular:  ?   Rate and Rhythm: Normal rate and regular rhythm.  ?   Heart sounds: Normal heart sounds.  ?Pulmonary:  ?   Effort: Pulmonary effort is normal. No respiratory distress.  ?   Breath sounds: Normal breath sounds.  ?Abdominal:  ?   General: Abdomen is flat.  ?   Palpations: Abdomen is soft.  ?Musculoskeletal:     ?   General: Normal range of motion.  ?   Cervical back: Normal range of motion.   ?Skin: ?   General: Skin is warm and dry.  ?   Capillary Refill: Capillary refill takes less than 2 seconds.  ?Neurological:  ?   General: No focal deficit present.  ?   Mental Status: She is alert and oriented to person, place, and time.  ?   GCS: GCS eye subscore is 4. GCS verbal subscore is 5. GCS motor subscore is 6.  ?   Sensory: Sensation is intact.  ?   Motor: Motor function is intact.  ?   Coordination: Coordination is intact. Coordination normal. Finger-Nose-Finger Test normal.  ?   Gait: Gait is intact.  ?   Comments: Alert and oriented to self, place, time and event.  ?  ?Speech is fluent, clear without dysarthria or dysphasia.  ?  ?Strength 5/5 in upper/lower extremities   ?Sensation intact in upper/lower extremities  ? ?Able to ambulate throughout hallway slowly but with steady gait ?  ?CN I not tested  ?CN II grossly intact visual fields bilaterally. Did not visualize posterior eye.  ?CN III, IV, VI PERRLA and EOMs intact bilaterally  ?CN V Intact sensation to sharp and light touch to the face  ?CN VII facial movements symmetric  ?CN VIII not tested  ?CN IX, X no uvula deviation, symmetric rise of soft palate  ?CN XI 5/5 SCM and trapezius strength bilaterally  ?CN XII Midline tongue protrusion, symmetric L/R movements   ?Psychiatric:     ?   Mood and Affect: Mood normal.     ?   Behavior: Behavior normal.  ? ? ?ED Results / Procedures / Treatments   ?Labs ?(all labs ordered are listed, but only abnormal results are displayed) ?Labs Reviewed  ?BASIC METABOLIC PANEL - Abnormal; Notable for the following components:  ?    Result Value  ? Glucose, Bld 116 (*)   ? Calcium 8.6 (*)   ? All other components within normal limits  ?CBC - Abnormal; Notable for the following components:  ? Hemoglobin 8.6 (*)   ? HCT 32.0 (*)   ? MCV 67.8 (*)   ? MCH 18.2 (*)   ? MCHC 26.9 (*)   ? RDW 18.6 (*)   ? Platelets 463 (*)   ? All other components within normal limits  ?URINALYSIS, ROUTINE W REFLEX MICROSCOPIC  ?I-STAT  BETA HCG BLOOD, ED ([facility], [facility], [facility] ONLY)  ?CBG MONITORING, ED  ? ? ?EKG ?EKG Interpretation ? ?Date/Time:  Thursday March 09 2022 10:43:26 EDT ?Ventricular Rate:  77 ?PR Interval:  198 ?QRS Duration: 80 ?QT Interval:  376 ?QTC Calculation: 425 ?R Axis:   -6 ?Text Interpretation: Normal sinus rhythm Minimal voltage criteria for LVH, may be normal variant ( R in aVL ) Cannot rule out Anterior infarct , age undetermined Abnormal ECG When compared with ECG of 27-Dec-2021 12:04, PREVIOUS ECG IS PRESENT Confirmed by Lorre NickAllen, Anthony (  [id number]) on 03/09/2022 9:23:46 PM ? ?Radiology ?No results found. ? ?Procedures ?Procedures  ? ? ?Medications Ordered in ED ?Medications  ?meclizine (ANTIVERT) tablet 25 mg (has no administration in time range)  ?meclizine (ANTIVERT) tablet 25 mg (25 mg Oral Given 03/09/22 1105)  ? ? ?ED Course/ Medical Decision Making/ A&P ?  ?                        ?Medical Decision Making ? ?This patient presents to the ED for concern of dizziness, this involves an extensive number of treatment options, and is a complaint that carries with it a high risk of complications and morbidity. ? ? ?Co morbidities that complicate the patient evaluation ? ?Hx of hypertension and hyperlipidemia ? ? ?Lab Tests: ? ?I Ordered, and personally interpreted labs.  The pertinent results include:  hgb 8.6 unchanged from previous, no other acute laboratory findings ? ? ?Cardiac Monitoring: / EKG: ? ?The patient was maintained on a cardiac monitor.  I personally viewed and interpreted the cardiac monitored which showed an underlying rhythm of: NSR, no STEMI ? ? ?Problem List / ED Course / Critical interventions / Medication management ? ?I ordered medication including meclizine  for vertigo  ?Reevaluation of the patient after these medicines showed that the patient improved ?I have reviewed the patients home medicines and have made adjustments as needed ? ? ? ?Test / Admission - Considered: ? ?Considered CT imaging of the head to r/o  stroke, however patient is alert and oriented and neurologically intact with significant improvement after meclizine. Therefore low suspicion for CVA explanation in young patient at relatively low stroke risk. Therefo

## 2022-03-09 NOTE — ED Notes (Signed)
Patient reports improvement in symptoms and states that the room is not spinning anymore.  ?

## 2022-03-09 NOTE — ED Triage Notes (Signed)
Pt reports onset this am of feeling very weak, unsteady and dizzy. Reports it feels like room is spinning. Has nausea, no vomiting.  ?

## 2022-03-09 NOTE — ED Notes (Signed)
Patient ambulated with PA in hallway with minimal assistance. ?

## 2022-03-09 NOTE — ED Provider Triage Note (Signed)
Emergency Medicine Provider Triage Evaluation Note ? ?[name] , a 36 y.o. female  was evaluated in triage.  Pt complains of dizzy. ? ?Review of Systems  ?Positive: Dizzy, nausea ?Negative: Headache, cp, sob, cold sxs, abd pain ? ?Physical Exam  ?BP (!) 165/101 (BP Location: Right Arm)   Pulse 82   Temp 98.5 ?F (36.9 ?C)   Resp 20   SpO2 97%  ?Gen:   Awake, no distress   ?Resp:  Normal effort  ?MSK:   Moves extremities without difficulty  ?Other:   ? ?Medical Decision Making  ?Medically screening exam initiated at 10:53 AM.  Appropriate orders placed.  [name] was informed that the remainder of the evaluation will be completed by another provider, this initial triage assessment does not replace that evaluation, and the importance of remaining in the ED until their evaluation is complete. ? ?Report having bouts of dizziness with room spinning sensation which started this AM.  No pain.  On her menstruation ?  ?Fayrene Helper, PA-C ?03/09/22 1056 ? ?

## 2022-03-10 MED ORDER — MECLIZINE HCL 25 MG PO TABS: 25.0000 mg | ORAL_TABLET | Freq: Three times a day (TID) | ORAL | 0 refills | Status: DC | PRN

## 2022-03-10 NOTE — Discharge Instructions (Addendum)
As we discussed, your work-up in the ER today was reassuring for acute abnormalities.  I suspect that your symptoms are related to vertigo.  I have given you a prescription for the medication that you were given in the ER today that seem to help your symptoms.  Please take this as prescribed as needed for dizziness.  I also recommend that you stay adequately hydrated and take iron supplementation as it appears that you do have anemia.  I have also given you information about something called the Epley maneuver which can be performed at home to relieve the vertigo.  I recommend close PCP follow-up for continued evaluation and management of your symptoms. ? ?Return if development of any new or worsening symptoms. ?

## 2022-03-24 ENCOUNTER — Ambulatory Visit (INDEPENDENT_AMBULATORY_CARE_PROVIDER_SITE_OTHER): Payer: PRIVATE HEALTH INSURANCE | Admitting: Family

## 2022-03-24 VITALS — BP 126/90 | HR 89 | Temp 97.1°F | Resp 18 | Ht 66.0 in | Wt 317.0 lb

## 2022-03-24 DIAGNOSIS — R42 Dizziness and giddiness: Secondary | ICD-10-CM | POA: Diagnosis not present

## 2022-03-24 LAB — CBC WITH DIFFERENTIAL/PLATELET
Absolute Monocytes: 409 cells/uL (ref 200–950)
Basophils Absolute: 50 cells/uL (ref 0–200)
Basophils Relative: 0.9 %
Eosinophils Absolute: 140 cells/uL (ref 15–500)
Eosinophils Relative: 2.5 %
HCT: 31.1 % — ABNORMAL LOW (ref 35.0–45.0)
Hemoglobin: 8.7 g/dL — ABNORMAL LOW (ref 11.7–15.5)
Lymphs Abs: 2408 cells/uL (ref 850–3900)
MCH: 18.8 pg — ABNORMAL LOW (ref 27.0–33.0)
MCHC: 28 g/dL — ABNORMAL LOW (ref 32.0–36.0)
MCV: 67 fL — ABNORMAL LOW (ref 80.0–100.0)
MPV: 9.2 fL (ref 7.5–12.5)
Monocytes Relative: 7.3 %
Neutro Abs: 2593 cells/uL (ref 1500–7800)
Neutrophils Relative %: 46.3 %
Platelets: 448 10*3/uL — ABNORMAL HIGH (ref 140–400)
RBC: 4.64 10*6/uL (ref 3.80–5.10)
RDW: 17.8 % — ABNORMAL HIGH (ref 11.0–15.0)
Total Lymphocyte: 43 %
WBC: 5.6 10*3/uL (ref 3.8–10.8)

## 2022-03-24 LAB — CBC MORPHOLOGY

## 2022-03-24 NOTE — Progress Notes (Signed)
? ?Provider: Richarda Blade FNP-C ? ?Thaddius Manes, Donalee Citrin, NP ? ?Patient Care Team: ?Shakeem Stern, Donalee Citrin, NP as PCP - General (Family Medicine) ? ?Extended Emergency Contact Information ?Primary Emergency Contact: Marquess,[name] ?Address: [address] ?         [place], [place] [address] [place] ?Mobile Phone: [phone] ?Relation: Spouse ? ?Code Status:  Full Code  ?Goals of care: Advanced Directive information ? ?  02/24/2022  ?  9:32 AM  ?Advanced Directives  ?Does Patient Have a Medical Advance Directive? No  ?Would patient like information on creating a medical advance directive? No - Patient declined  ? ? ? ?Chief Complaint  ?Patient presents with  ? Acute Visit  ?  ER Follow up for Dizziness. Has noticed at second job, [employer] when picking up boxes she feels the Dizziness.   ? ? ?HPI:  ?Pt is a 36 y.o. female seen today for an acute visit for ED visit on 03/09/2022 follow up for dizziness.symptoms began when she went to stand up to get out of bed felt intoxicated but denies any alcohol use.Feeling was worst with head movements.states was on her Menses when she went to ED. Hemoglobin was 8.6 previous 8.8 she was able to ambulate in the ED with steady gait therefore low suspicion of intracranial etiology therefore CT scan was deferred.she was treated with Meclizine and symptoms improved.she was not Orthostatic.EKG showed NSR with no STEMI.Her dizziness was thought to be possible BPPV related. She was advised to follow up with PCP for close monitoring.  ?States symptoms have improved.took meclizine on Saturday 03/18/2022 when she had to work at [employer].states duties involve bending and standing and twisting which seems to worsen her symptoms. Request letter to switch her job to duties that do not involve bending ,standing and twisting which worsen her dizziness and can easily fall. ?She denies any symptoms today.  ? ? ?Past Medical History:  ?Diagnosis Date  ? Alcohol use disorder in remission   ?  Anemia   ? Former cigarette smoker   ? Hypertension   ? Pregnancy induced hypertension   ? Sleep apnea   ? ?Past Surgical History:  ?Procedure Laterality Date  ? CESAREAN SECTION    ? CESAREAN SECTION WITH BILATERAL TUBAL LIGATION  03/02/2014  ? Procedure: Repeat CESAREAN SECTION WITH BILATERAL TUBAL LIGATION;  Surgeon: Antionette Char, MD;  Location: [facility];  Service: Obstetrics;;  ? DILATION AND CURETTAGE OF UTERUS    ? WISDOM TOOTH EXTRACTION    ? ? ?No Known Allergies ? ?Outpatient Encounter Medications as of 03/24/2022  ?Medication Sig  ? docusate sodium (COLACE) 100 MG capsule Take 1 capsule (100 mg total) by mouth daily. (Patient not taking: Reported on 03/24/2022)  ? Iron, Ferrous Sulfate, 325 (65 Fe) MG TABS Take 325 mg by mouth daily. (Patient not taking: Reported on 03/24/2022)  ? losartan (COZAAR) 50 MG tablet Take 1 tablet (50 mg total) by mouth daily.  ? meclizine (ANTIVERT) 25 MG tablet Take 1 tablet (25 mg total) by mouth 3 (three) times daily as needed for dizziness.  ? [DISCONTINUED] amLODipine (NORVASC) 5 MG tablet Take 1 tablet (5 mg total) by mouth daily. (Patient not taking: Reported on 04/07/2021)  ? [DISCONTINUED] valsartan (DIOVAN) 160 MG tablet Take 160 mg by mouth daily. (Patient not taking: Reported on 04/07/2021)  ? ?No facility-administered encounter medications on file as of 03/24/2022.  ? ? ?Review of Systems  ?Constitutional:  Negative for appetite change, chills, fatigue, fever and  unexpected weight change.  ?HENT:  Negative for congestion, dental problem, ear discharge, ear pain, facial swelling, hearing loss, nosebleeds, postnasal drip, rhinorrhea, sinus pressure, sinus pain, sneezing, sore throat, tinnitus and trouble swallowing.   ?Eyes:  Negative for pain, discharge, redness, itching and visual disturbance.  ?Respiratory:  Negative for cough, chest tightness, shortness of breath and wheezing.   ?Cardiovascular:  Negative for chest pain, palpitations and leg swelling.   ?Gastrointestinal:  Negative for abdominal distention, abdominal pain, blood in stool, constipation, diarrhea, nausea and vomiting.  ?Endocrine: Negative for cold intolerance, heat intolerance, polydipsia, polyphagia and polyuria.  ?Genitourinary:  Negative for difficulty urinating, dysuria, flank pain, frequency and urgency.  ?Musculoskeletal:  Negative for arthralgias, back pain, gait problem, joint swelling, myalgias, neck pain and neck stiffness.  ?Skin:  Negative for color change, pallor, rash and wound.  ?Neurological:  Positive for dizziness. Negative for syncope, speech difficulty, weakness, light-headedness, numbness and headaches.  ?Hematological:  Does not bruise/bleed easily.  ?Psychiatric/Behavioral:  Negative for agitation, behavioral problems, confusion, hallucinations, self-injury, sleep disturbance and suicidal ideas. The patient is not nervous/anxious.   ? ?Immunization History  ?Administered Date(s) Administered  ? Influenza Inj Mdck Quad Pf 08/20/2018  ? PFIZER(Purple Top)SARS-COV-2 Vaccination 02/05/2020, 02/26/2020  ? ?Pertinent  Health Maintenance Due  ?Topic Date Due  ? PAP SMEAR-Modifier  08/07/2016  ? INFLUENZA VACCINE  06/27/2022  ? ? ?  02/17/2022  ?  8:59 AM 02/24/2022  ?  9:31 AM 03/09/2022  ? 11:01 AM 03/09/2022  ? 11:02 AM 03/09/2022  ? 10:49 PM  ?Fall Risk  ?Falls in the past year? 0 0     ?Was there an injury with Fall? 0 0     ?Fall Risk Category Calculator 0 0     ?Fall Risk Category Low Low     ?Patient Fall Risk Level Low fall risk Low fall risk Low fall risk Low fall risk Low fall risk  ?Patient at Risk for Falls Due to No Fall Risks No Fall Risks     ?Fall risk Follow up Falls evaluation completed Falls evaluation completed     ? ?Functional Status Survey: ?  ? ?Vitals:  ? 03/24/22 1000  ?BP: 126/90  ?Pulse: 89  ?Resp: 18  ?Temp: (!) 97.1 ?F (36.2 ?C)  ?SpO2: 99%  ?Weight: (!) 317 lb (143.8 kg)  ?Height: 5\' 6"  (1.676 m)  ? ?Body mass index is 51.17 kg/m? ?Physical Exam ?Vitals  reviewed.  ?Constitutional:   ?   General: She is not in acute distress. ?   Appearance: Normal appearance. She is normal weight. She is not ill-appearing or diaphoretic.  ?HENT:  ?   Head: Normocephalic.  ?   Right Ear: Tympanic membrane, ear canal and external ear normal. There is no impacted cerumen.  ?   Left Ear: Tympanic membrane, ear canal and external ear normal. There is no impacted cerumen.  ?   Nose: Nose normal. No congestion or rhinorrhea.  ?   Mouth/Throat:  ?   Mouth: Mucous membranes are moist.  ?   Pharynx: Oropharynx is clear. No oropharyngeal exudate or posterior oropharyngeal erythema.  ?Eyes:  ?   General: No scleral icterus.    ?   Right eye: No discharge.     ?   Left eye: No discharge.  ?   Extraocular Movements: Extraocular movements intact.  ?   Conjunctiva/sclera: Conjunctivae normal.  ?   Pupils: Pupils are equal, round, and reactive to light.  ?Neck:  ?  Vascular: No carotid bruit.  ?Cardiovascular:  ?   Rate and Rhythm: Normal rate and regular rhythm.  ?   Pulses: Normal pulses.  ?   Heart sounds: Normal heart sounds. No murmur heard. ?  No friction rub. No gallop.  ?Pulmonary:  ?   Effort: Pulmonary effort is normal. No respiratory distress.  ?   Breath sounds: Normal breath sounds. No wheezing, rhonchi or rales.  ?Chest:  ?   Chest wall: No tenderness.  ?Abdominal:  ?   General: Bowel sounds are normal. There is no distension.  ?   Palpations: Abdomen is soft. There is no mass.  ?   Tenderness: There is no abdominal tenderness. There is no right CVA tenderness, left CVA tenderness, guarding or rebound.  ?Musculoskeletal:     ?   General: No swelling or tenderness. Normal range of motion.  ?   Cervical back: Normal range of motion. No rigidity or tenderness.  ?   Right lower leg: No edema.  ?   Left lower leg: No edema.  ?Lymphadenopathy:  ?   Cervical: No cervical adenopathy.  ?Skin: ?   General: Skin is warm and dry.  ?   Coloration: Skin is not pale.  ?   Findings: No bruising,  erythema, lesion or rash.  ?Neurological:  ?   Mental Status: She is alert and oriented to person, place, and time.  ?   Cranial Nerves: No cranial nerve deficit.  ?   Sensory: No sensory deficit.  ?   Motor: No weakness.

## 2022-03-24 NOTE — Patient Instructions (Signed)
- continue on Meclizine as needed for dizziness  ? ?Dizziness ?Dizziness is a common problem. It is a feeling of unsteadiness or light-headedness. You may feel like you are about to faint. Dizziness can lead to injury if you stumble or fall. Anyone can become dizzy, but dizziness is more common in older adults. This condition can be caused by a number of things, including medicines, dehydration, or illness. ?Follow these instructions at home: ?Eating and drinking ? ?Drink enough fluid to keep your urine pale yellow. This helps to keep you from becoming dehydrated. Try to drink more clear fluids, such as water. ?Do not drink alcohol. ?Limit your caffeine intake if told to do so by your health care provider. Check ingredients and nutrition facts to see if a food or beverage contains caffeine. ?Limit your salt (sodium) intake if told to do so by your health care provider. Check ingredients and nutrition facts to see if a food or beverage contains sodium. ?Activity ? ?Avoid making quick movements. ?Rise slowly from chairs and steady yourself until you feel okay. ?In the morning, first sit up on the side of the bed. When you feel okay, stand slowly while you hold onto something until you know that your balance is good. ?If you need to stand in one place for a long time, move your legs often. Tighten and relax the muscles in your legs while you are standing. ?Do not drive or use machinery if you feel dizzy. ?Avoid bending down if you feel dizzy. Place items in your home so that they are easy for you to reach without leaning over. ?Lifestyle ?Do not use any products that contain nicotine or tobacco. These products include cigarettes, chewing tobacco, and vaping devices, such as e-cigarettes. If you need help quitting, ask your health care provider. ?Try to reduce your stress level by using methods such as yoga or meditation. Talk with your health care provider if you need help to manage your stress. ?General  instructions ?Watch your dizziness for any changes. ?Take over-the-counter and prescription medicines only as told by your health care provider. Talk with your health care provider if you think that your dizziness is caused by a medicine that you are taking. ?Tell a friend or a family member that you are feeling dizzy. If he or she notices any changes in your behavior, have this person call your health care provider. ?Keep all follow-up visits. This is important. ?Contact a health care provider if: ?Your dizziness does not go away or you have new symptoms. ?Your dizziness or light-headedness gets worse. ?You feel nauseous. ?You have reduced hearing. ?You have a fever. ?You have neck pain or a stiff neck. ?Your dizziness leads to an injury or a fall. ?Get help right away if: ?You vomit or have diarrhea and are unable to eat or drink anything. ?You have problems talking, walking, swallowing, or using your arms, hands, or legs. ?You feel generally weak. ?You have any bleeding. ?You are not thinking clearly or you have trouble forming sentences. It may take a friend or family member to notice this. ?You have chest pain, abdominal pain, shortness of breath, or sweating. ?Your vision changes or you develop a severe headache. ?These symptoms may represent a serious problem that is an emergency. Do not wait to see if the symptoms will go away. Get medical help right away. Call your local emergency services (911 in the [place] Do not drive yourself to the hospital. ?Summary ?Dizziness is a feeling of unsteadiness or  light-headedness. This condition can be caused by a number of things, including medicines, dehydration, or illness. ?Anyone can become dizzy, but dizziness is more common in older adults. ?Drink enough fluid to keep your urine pale yellow. Do not drink alcohol. ?Avoid making quick movements if you feel dizzy. Monitor your dizziness for any changes. ?This information is not intended to replace advice given to you  by your health care provider. Make sure you discuss any questions you have with your health care provider. ?Document Revised: 10/18/2020 Document Reviewed: 10/18/2020 ?Elsevier Patient Education ? 2023 [employer] ?  ?

## 2022-03-27 ENCOUNTER — Telehealth: Payer: Self-pay

## 2022-03-27 NOTE — Telephone Encounter (Signed)
Form completed given to Synetta Fail to be faxed per patient's request.  ?

## 2022-03-27 NOTE — Telephone Encounter (Signed)
Patient dropped off paperwork to be completed by Dinah.  ? ?Patient wrote on a sticky note that she would like a call once form is completed and faxed. ? ?Demographic information was completed and form was placed in Dinah's review and sign folder.  ? ? ?

## 2022-03-28 NOTE — Telephone Encounter (Signed)
Form was given to the administrative staff to collect 25 dollar form completion payment by debit/credit prior to processing.  ? ?Once payment is collected or patient opts to be billed form will be faxed and sent to scanning by an administrative team member ? ? ? ?

## 2022-05-18 ENCOUNTER — Encounter: Payer: Self-pay | Admitting: Family

## 2022-05-18 ENCOUNTER — Telehealth (INDEPENDENT_AMBULATORY_CARE_PROVIDER_SITE_OTHER): Payer: PRIVATE HEALTH INSURANCE | Admitting: Family

## 2022-05-18 DIAGNOSIS — G4733 Obstructive sleep apnea (adult) (pediatric): Secondary | ICD-10-CM | POA: Diagnosis not present

## 2022-05-18 NOTE — Progress Notes (Signed)
This service is provided via telemedicine  No vital signs collected/recorded due to the encounter was a telemedicine visit.   Location of patient (ex: home, work):  Home  Patient consents to a telephone visit:  Yes  Location of the provider (ex: office, home):  [employer].  Name of any referring provider:  Bralon Antkowiak, Donalee Citrin, NP   Names of all persons participating in the telemedicine service and their role in the encounter:  Patient, Meda Klinefelter, RMA, Marcellous Snarski, Carilyn Goodpasture, NP.    Time spent on call: 8 minutes spent on the phone with Medical Assistant.      Provider: Richarda Blade FNP-C  Cashis Rill, Donalee Citrin, NP  Patient Care Team: Arlone Lenhardt, Donalee Citrin, NP as PCP - General (Family Medicine)  Extended Emergency Contact Information Primary Emergency Contact: Bellerose,[name] Address: [address]          [place], [place] [address] [place] Mobile Phone: [phone] Relation: Spouse  Code Status:  Full Code  Goals of care: Advanced Directive information    05/18/2022    1:40 PM  Advanced Directives  Does Patient Have a Medical Advance Directive? No  Would patient like information on creating a medical advance directive? No - Patient declined     Chief Complaint  Patient presents with   Acute Visit    Patient request referral for sleep study. Patient states that DOT Physical is now requiring her to have one completed.    HPI:  Pt is a 35 y.o. female seen today for an acute visit for sleep Apnea.States her DOT physical exam requires her to have a sleep study.Has a significant medical history of sleep Apnea and morbid obesity.she does not use a CPAP at night.Had a sleep study about one and a half year ago.  She denies any daytime sleepiness or tiredness.  No recent acute illnesses.   Past Medical History:  Diagnosis Date   Alcohol use disorder in remission    Anemia    Former cigarette smoker    Hypertension    Pregnancy induced hypertension     Sleep apnea    Past Surgical History:  Procedure Laterality Date   CESAREAN SECTION     CESAREAN SECTION WITH BILATERAL TUBAL LIGATION  03/02/2014   Procedure: Repeat CESAREAN SECTION WITH BILATERAL TUBAL LIGATION;  Surgeon: Antionette Char, MD;  Location: [facility];  Service: Obstetrics;;   DILATION AND CURETTAGE OF UTERUS     WISDOM TOOTH EXTRACTION      No Known Allergies  Outpatient Encounter Medications as of 05/18/2022  Medication Sig   losartan (COZAAR) 50 MG tablet Take 1 tablet (50 mg total) by mouth daily.   meclizine (ANTIVERT) 25 MG tablet Take 1 tablet (25 mg total) by mouth 3 (three) times daily as needed for dizziness.   [DISCONTINUED] amLODipine (NORVASC) 5 MG tablet Take 1 tablet (5 mg total) by mouth daily. (Patient not taking: Reported on 04/07/2021)   [DISCONTINUED] docusate sodium (COLACE) 100 MG capsule Take 1 capsule (100 mg total) by mouth daily. (Patient not taking: Reported on 03/24/2022)   [DISCONTINUED] Iron, Ferrous Sulfate, 325 (65 Fe) MG TABS Take 325 mg by mouth daily. (Patient not taking: Reported on 03/24/2022)   [DISCONTINUED] valsartan (DIOVAN) 160 MG tablet Take 160 mg by mouth daily. (Patient not taking: Reported on 04/07/2021)   No facility-administered encounter medications on file as of 05/18/2022.    Review of Systems  Constitutional:  Negative for chills, fatigue and fever.  Respiratory:  Negative for cough, chest tightness, shortness of breath and wheezing.   Cardiovascular:  Negative for chest pain, palpitations and leg swelling.  Neurological:  Negative for dizziness, light-headedness and headaches.  Psychiatric/Behavioral:  Negative for agitation, behavioral problems, confusion and sleep disturbance.     Immunization History  Administered Date(s) Administered   Influenza Inj Mdck Quad Pf 08/20/2018   PFIZER(Purple Top)SARS-COV-2 Vaccination 02/05/2020, 02/26/2020   Pertinent  Health Maintenance Due  Topic Date Due   PAP SMEAR-Modifier   08/07/2016   INFLUENZA VACCINE  06/27/2022      02/24/2022    9:31 AM 03/09/2022   11:01 AM 03/09/2022   11:02 AM 03/09/2022   10:49 PM 05/18/2022    1:40 PM  Fall Risk  Falls in the past year? 0    0  Was there an injury with Fall? 0    0  Fall Risk Category Calculator 0    0  Fall Risk Category Low    Low  Patient Fall Risk Level Low fall risk Low fall risk Low fall risk Low fall risk Low fall risk  Patient at Risk for Falls Due to No Fall Risks    No Fall Risks  Fall risk Follow up Falls evaluation completed    Falls evaluation completed   Functional Status Survey:    There were no vitals filed for this visit. There is no height or weight on file to calculate BMI.  Physical Exam Unable to complete on telephone visit  Labs reviewed: Recent Labs    01/21/22 2324 02/24/22 1007 03/09/22 1051  NA 135 138 138  K 3.5 4.4 3.5  CL 103 107 108  CO2 26 24 24   GLUCOSE 120* 94 116*  BUN 10 11 8   CREATININE 0.79 0.82 0.78  CALCIUM 8.8* 9.0 8.6*   Recent Labs    01/21/22 2324 02/24/22 1007  AST 17 11  ALT 16 10  ALKPHOS 58  --   BILITOT 0.3 0.3  PROT 6.6 6.6  ALBUMIN 3.5  --    Recent Labs    01/21/22 2324 02/24/22 1007 03/09/22 1051 03/24/22 1012  WBC 8.2 4.6 4.4 5.6  NEUTROABS 4.2 1,845  --  2,593  HGB 8.1* 8.8* 8.6* 8.7*  HCT 30.7* 32.1* 32.0* 31.1*  MCV 68.8* 67.2* 67.8* 67.0*  PLT 416* 392 463* 448*   Lab Results  Component Value Date   TSH 5.29 (H) 02/24/2022   No results found for: "HGBA1C" Lab Results  Component Value Date   CHOL 224 (H) 02/24/2022   HDL 52 02/24/2022   LDLCALC 152 (H) 02/24/2022   TRIG 93 02/24/2022   CHOLHDL 4.3 02/24/2022    Significant Diagnostic Results in last 30 days:  No results found.  Assessment/Plan  Obstructive sleep apnea - DOT requires sleep study  - does not use CPAP machine at bedtime had sleep study in the past with previous provider but did not get CPAP machine. - discuss referral to pulmonologist for  evaluation.  - Ambulatory referral to Pulmonology  Family/ staff Communication: Reviewed plan of care with patient verbalized understanding  Labs/tests ordered: None   Next Appointment: Has appt 06/23/2022  I connected with  02/26/2022 on 05/18/22 by a Telephone enabled telemedicine application and verified that I am speaking with the correct person using two identifiers.   I discussed the limitations of evaluation and management by telemedicine. The patient expressed understanding and agreed to proceed.  Spent 11 minutes of non-face to face with  patient  >50% time spent counseling; reviewing medical record; tests and developing future plan of care.   Caesar Bookman, NP

## 2022-05-20 ENCOUNTER — Other Ambulatory Visit: Payer: Self-pay | Admitting: Family

## 2022-05-20 DIAGNOSIS — I10 Essential (primary) hypertension: Secondary | ICD-10-CM

## 2022-06-08 ENCOUNTER — Telehealth: Payer: Self-pay | Admitting: *Deleted

## 2022-06-08 NOTE — Telephone Encounter (Signed)
Ngetich, Donalee Citrin, NP  Corinna Lines, Beckey Downing, CMA Hi Clydie Braun,  Here is the referral note:   Referral sent to [facility] they will contact patient with appointment.     [facility]     Address: [address], [place], [place] [address]     Phone: [phone]   Please give patient the GI's office to check on the referral or call to follow up on referral.   Thanks        Previous Messages    ----- Message -----  From: Darron Doom  Sent: 06/07/2022   3:55 PM EDT  To: Caesar Bookman, NP  Subject: Sleep Study                                     Patient called in to say that she have been waiting for a call for a sleep study but have not heard anything since her last visit. Please advise        Patient notified and given Referral information.

## 2022-06-22 NOTE — Patient Instructions (Signed)
Please contact your local pharmacy, previous provider, or insurance carrier for vaccine/immunization records. Ensure that any procedures done outside of [facility] are faxed to us [phone] or you can sign release of records form at the front desk to keep your medical record updated.   ?

## 2022-06-23 ENCOUNTER — Ambulatory Visit (INDEPENDENT_AMBULATORY_CARE_PROVIDER_SITE_OTHER): Payer: PRIVATE HEALTH INSURANCE | Admitting: Family

## 2022-06-23 ENCOUNTER — Encounter: Payer: Self-pay | Admitting: Family

## 2022-06-23 VITALS — BP 120/88 | HR 85 | Temp 97.8°F | Resp 18 | Ht 66.0 in | Wt 325.0 lb

## 2022-06-23 DIAGNOSIS — I10 Essential (primary) hypertension: Secondary | ICD-10-CM

## 2022-06-23 DIAGNOSIS — G4733 Obstructive sleep apnea (adult) (pediatric): Secondary | ICD-10-CM

## 2022-06-23 DIAGNOSIS — E782 Mixed hyperlipidemia: Secondary | ICD-10-CM | POA: Diagnosis not present

## 2022-06-23 DIAGNOSIS — R7989 Other specified abnormal findings of blood chemistry: Secondary | ICD-10-CM | POA: Diagnosis not present

## 2022-06-23 NOTE — Progress Notes (Signed)
Provider: Richarda Blade FNP-C   Makale Pindell, Donalee Citrin, NP  Patient Care Team: Nalah Macioce, Donalee Citrin, NP as PCP - General (Family Medicine)  Extended Emergency Contact Information Primary Emergency Contact: Allsup,[name] Address: [address]          [place], [place] [address] [place] Mobile Phone: [phone] Relation: Spouse  Code Status:  Full Code  Goals of care: Advanced Directive information    06/23/2022    9:59 AM  Advanced Directives  Does Patient Have a Medical Advance Directive? No  Would patient like information on creating a medical advance directive? No - Patient declined     Chief Complaint  Patient presents with  . Medical Management of Chronic Issues    4 month follow up.   Marland Kitchen Health Maintenance    Discuss the need for Pap smear.   . Immunizations    Discuss the need for Covid Booster, and Tetanus vaccine.    HPI:  Pt is a 36 y.o. female seen today for 4 months follow up for medical management of chronic diseases.    States dizziness has resolved would need a letter of clearance to return to work.Leaving for anniversary cruise with Husband in one week will update provider when to return to work.  Hypertension - B/p within normal range on losartan. denies any headache,dizziness,vision changes,fatigue,chest tightness,palpitation,chest pain or shortness of breath.   Abnormal TSH - previous TSH was slightly elevated 5.29 will recheck TSH level today. Has been feeling depressed since does not have a second Job currently so finance has been hard on the family.Symptoms not bad enough to start on antidepressant.Has been coping by praying a lot.  Weight gain - has gained weight since last visit 317 lbs to 325 lbs.has been eating and snacking more due to feeling depressed. No exercise.    Past Medical History:  Diagnosis Date  . Alcohol use disorder in remission   . Anemia   . Former cigarette smoker   . Hypertension   . Pregnancy induced  hypertension   . Sleep apnea    Past Surgical History:  Procedure Laterality Date  . CESAREAN SECTION    . CESAREAN SECTION WITH BILATERAL TUBAL LIGATION  03/02/2014   Procedure: Repeat CESAREAN SECTION WITH BILATERAL TUBAL LIGATION;  Surgeon: Antionette Char, MD;  Location: [facility];  Service: Obstetrics;;  . DILATION AND CURETTAGE OF UTERUS    . WISDOM TOOTH EXTRACTION      No Known Allergies  Allergies as of 06/23/2022   No Known Allergies      Medication List        Accurate as of June 23, 2022 10:06 AM. If you have any questions, ask your nurse or doctor.          losartan 50 MG tablet Commonly known as: COZAAR TAKE 1 TABLET BY MOUTH EVERY DAY   meclizine 25 MG tablet Commonly known as: ANTIVERT Take 1 tablet (25 mg total) by mouth 3 (three) times daily as needed for dizziness.        Review of Systems  Constitutional:  Negative for appetite change, chills, fatigue, fever and unexpected weight change.  HENT:  Negative for congestion, dental problem, ear discharge, ear pain, facial swelling, hearing loss, nosebleeds, postnasal drip, rhinorrhea, sinus pressure, sinus pain, sneezing, sore throat, tinnitus and trouble swallowing.   Eyes:  Negative for pain, discharge, redness, itching and visual disturbance.  Respiratory:  Negative for cough, chest tightness, shortness of breath and wheezing.  Cardiovascular:  Negative for chest pain, palpitations and leg swelling.  Gastrointestinal:  Negative for abdominal distention, abdominal pain, blood in stool, constipation, diarrhea, nausea and vomiting.  Endocrine: Negative for cold intolerance, heat intolerance, polydipsia, polyphagia and polyuria.  Genitourinary:  Negative for difficulty urinating, dysuria, flank pain, frequency and urgency.  Musculoskeletal:  Negative for arthralgias, back pain, gait problem, joint swelling, myalgias, neck pain and neck stiffness.  Skin:  Negative for color change, pallor, rash and wound.   Neurological:  Negative for dizziness, syncope, speech difficulty, weakness, light-headedness, numbness and headaches.  Hematological:  Does not bruise/bleed easily.  Psychiatric/Behavioral:  Negative for agitation, behavioral problems, confusion, hallucinations, self-injury, sleep disturbance and suicidal ideas. The patient is not nervous/anxious.     Immunization History  Administered Date(s) Administered  . Influenza Inj Mdck Quad Pf 08/20/2018  . PFIZER(Purple Top)SARS-COV-2 Vaccination 02/05/2020, 02/26/2020   Pertinent  Health Maintenance Due  Topic Date Due  . PAP SMEAR-Modifier  08/07/2016  . INFLUENZA VACCINE  06/27/2022      03/09/2022   11:01 AM 03/09/2022   11:02 AM 03/09/2022   10:49 PM 05/18/2022    1:40 PM 06/23/2022    9:59 AM  Fall Risk  Falls in the past year?    0 0  Was there an injury with Fall?    0 0  Fall Risk Category Calculator    0 0  Fall Risk Category    Low Low  Patient Fall Risk Level Low fall risk Low fall risk Low fall risk Low fall risk Low fall risk  Patient at Risk for Falls Due to    No Fall Risks No Fall Risks  Fall risk Follow up    Falls evaluation completed Falls evaluation completed   Functional Status Survey:    Vitals:   06/23/22 0958  BP: 120/88  Pulse: 85  Resp: 18  Temp: 97.8 F (36.6 C)  SpO2: 98%  Weight: (!) 325 lb (147.4 kg)  Height: 5\' 6"  (1.676 m)   Body mass index is 52.46 kg/m. Physical Exam Vitals reviewed.  Constitutional:      General: She is not in acute distress.    Appearance: Normal appearance. She is obese. She is not ill-appearing or diaphoretic.  HENT:     Head: Normocephalic.     Right Ear: Tympanic membrane, ear canal and external ear normal. There is no impacted cerumen.     Left Ear: Tympanic membrane, ear canal and external ear normal. There is no impacted cerumen.     Nose: Nose normal. No congestion or rhinorrhea.     Mouth/Throat:     Mouth: Mucous membranes are moist.     Pharynx:  Oropharynx is clear. No oropharyngeal exudate or posterior oropharyngeal erythema.  Eyes:     General: No scleral icterus.       Right eye: No discharge.        Left eye: No discharge.     Extraocular Movements: Extraocular movements intact.     Conjunctiva/sclera: Conjunctivae normal.     Pupils: Pupils are equal, round, and reactive to light.  Neck:     Vascular: No carotid bruit.  Cardiovascular:     Rate and Rhythm: Normal rate and regular rhythm.     Pulses: Normal pulses.     Heart sounds: Normal heart sounds. No murmur heard.    No friction rub. No gallop.  Pulmonary:     Effort: Pulmonary effort is normal. No respiratory distress.  Breath sounds: Normal breath sounds. No wheezing, rhonchi or rales.  Chest:     Chest wall: No tenderness.  Abdominal:     General: Bowel sounds are normal. There is no distension.     Palpations: Abdomen is soft. There is no mass.     Tenderness: There is no abdominal tenderness. There is no right CVA tenderness, left CVA tenderness, guarding or rebound.  Musculoskeletal:        General: No swelling or tenderness. Normal range of motion.     Cervical back: Normal range of motion. No rigidity or tenderness.     Right lower leg: No edema.     Left lower leg: No edema.  Lymphadenopathy:     Cervical: No cervical adenopathy.  Skin:    General: Skin is warm and dry.     Coloration: Skin is not pale.     Findings: No bruising, erythema, lesion or rash.  Neurological:     Mental Status: She is alert and oriented to person, place, and time.     Cranial Nerves: No cranial nerve deficit.     Sensory: No sensory deficit.     Motor: No weakness.     Coordination: Coordination normal.     Gait: Gait normal.  Psychiatric:        Mood and Affect: Mood normal.        Speech: Speech normal.        Behavior: Behavior normal.        Thought Content: Thought content normal.        Judgment: Judgment normal.    Labs reviewed: Recent Labs     01/21/22 2324 02/24/22 1007 03/09/22 1051  NA 135 138 138  K 3.5 4.4 3.5  CL 103 107 108  CO2 26 24 24   GLUCOSE 120* 94 116*  BUN 10 11 8   CREATININE 0.79 0.82 0.78  CALCIUM 8.8* 9.0 8.6*   Recent Labs    01/21/22 2324 02/24/22 1007  AST 17 11  ALT 16 10  ALKPHOS 58  --   BILITOT 0.3 0.3  PROT 6.6 6.6  ALBUMIN 3.5  --    Recent Labs    01/21/22 2324 02/24/22 1007 03/09/22 1051 03/24/22 1012  WBC 8.2 4.6 4.4 5.6  NEUTROABS 4.2 1,845  --  2,593  HGB 8.1* 8.8* 8.6* 8.7*  HCT 30.7* 32.1* 32.0* 31.1*  MCV 68.8* 67.2* 67.8* 67.0*  PLT 416* 392 463* 448*   Lab Results  Component Value Date   TSH 5.29 (H) 02/24/2022   No results found for: "HGBA1C" Lab Results  Component Value Date   CHOL 224 (H) 02/24/2022   HDL 52 02/24/2022   LDLCALC 152 (H) 02/24/2022   TRIG 93 02/24/2022   CHOLHDL 4.3 02/24/2022    Significant Diagnostic Results in last 30 days:  No results found.  Assessment/Plan There are no diagnoses linked to this encounter.   Family/ staff Communication: Reviewed plan of care with patient  Labs/tests ordered: None   Next Appointment :   02/26/2022, NP

## 2022-06-24 LAB — CBC WITH DIFFERENTIAL/PLATELET
Absolute Monocytes: 381 cells/uL (ref 200–950)
Basophils Absolute: 52 cells/uL (ref 0–200)
Basophils Relative: 1.1 %
Eosinophils Absolute: 150 cells/uL (ref 15–500)
Eosinophils Relative: 3.2 %
HCT: 30.9 % — ABNORMAL LOW (ref 35.0–45.0)
Hemoglobin: 8.4 g/dL — ABNORMAL LOW (ref 11.7–15.5)
Lymphs Abs: 1951 cells/uL (ref 850–3900)
MCH: 17.9 pg — ABNORMAL LOW (ref 27.0–33.0)
MCHC: 27.2 g/dL — ABNORMAL LOW (ref 32.0–36.0)
MCV: 65.7 fL — ABNORMAL LOW (ref 80.0–100.0)
MPV: 9.4 fL (ref 7.5–12.5)
Monocytes Relative: 8.1 %
Neutro Abs: 2167 cells/uL (ref 1500–7800)
Neutrophils Relative %: 46.1 %
Platelets: 440 10*3/uL — ABNORMAL HIGH (ref 140–400)
RBC: 4.7 10*6/uL (ref 3.80–5.10)
RDW: 18 % — ABNORMAL HIGH (ref 11.0–15.0)
Total Lymphocyte: 41.5 %
WBC: 4.7 10*3/uL (ref 3.8–10.8)

## 2022-06-24 LAB — COMPLETE METABOLIC PANEL WITH GFR
AG Ratio: 1.2 (calc) (ref 1.0–2.5)
ALT: 11 U/L (ref 6–29)
AST: 13 U/L (ref 10–30)
Albumin: 3.8 g/dL (ref 3.6–5.1)
Alkaline phosphatase (APISO): 64 U/L (ref 31–125)
BUN: 10 mg/dL (ref 7–25)
CO2: 25 mmol/L (ref 20–32)
Calcium: 8.8 mg/dL (ref 8.6–10.2)
Chloride: 106 mmol/L (ref 98–110)
Creat: 0.8 mg/dL (ref 0.50–0.97)
Globulin: 3.2 g/dL (calc) (ref 1.9–3.7)
Glucose, Bld: 102 mg/dL — ABNORMAL HIGH (ref 65–99)
Potassium: 4.3 mmol/L (ref 3.5–5.3)
Sodium: 139 mmol/L (ref 135–146)
Total Bilirubin: 0.3 mg/dL (ref 0.2–1.2)
Total Protein: 7 g/dL (ref 6.1–8.1)
eGFR: 97 mL/min/{1.73_m2} (ref >=60)

## 2022-06-24 LAB — LIPID PANEL
Cholesterol: 238 mg/dL — ABNORMAL HIGH (ref <200)
HDL: 53 mg/dL (ref >=50)
LDL Cholesterol (Calc): 162 mg/dL (calc) — ABNORMAL HIGH
Non-HDL Cholesterol (Calc): 185 mg/dL (calc) — ABNORMAL HIGH (ref <130)
Total CHOL/HDL Ratio: 4.5 (calc) (ref <5.0)
Triglycerides: 109 mg/dL (ref <150)

## 2022-06-24 LAB — CBC MORPHOLOGY

## 2022-06-24 LAB — TSH: TSH: 7.91 mIU/L — ABNORMAL HIGH

## 2022-07-03 ENCOUNTER — Other Ambulatory Visit: Payer: Self-pay

## 2022-07-03 DIAGNOSIS — R7989 Other specified abnormal findings of blood chemistry: Secondary | ICD-10-CM

## 2022-07-04 ENCOUNTER — Other Ambulatory Visit: Payer: Self-pay

## 2022-07-04 DIAGNOSIS — R739 Hyperglycemia, unspecified: Secondary | ICD-10-CM

## 2022-07-06 ENCOUNTER — Other Ambulatory Visit: Payer: PRIVATE HEALTH INSURANCE

## 2022-07-06 ENCOUNTER — Other Ambulatory Visit: Payer: Self-pay | Admitting: Family

## 2022-07-06 DIAGNOSIS — R739 Hyperglycemia, unspecified: Secondary | ICD-10-CM

## 2022-07-06 DIAGNOSIS — R7989 Other specified abnormal findings of blood chemistry: Secondary | ICD-10-CM

## 2022-07-09 LAB — T4, FREE: Free T4: 0.9 ng/dL (ref 0.8–1.8)

## 2022-07-09 LAB — T3, FREE: T3, Free: 3 pg/mL (ref 2.3–4.2)

## 2022-07-09 LAB — TEST AUTHORIZATION

## 2022-07-09 LAB — HEMOGLOBIN A1C
Hgb A1c MFr Bld: 5.9 % of total Hgb — ABNORMAL HIGH (ref <5.7)
Mean Plasma Glucose: 123 mg/dL
eAG (mmol/L): 6.8 mmol/L

## 2022-07-09 LAB — TSH: TSH: 13.85 mIU/L — ABNORMAL HIGH

## 2022-07-11 ENCOUNTER — Encounter: Payer: Self-pay | Admitting: Family

## 2022-07-11 ENCOUNTER — Telehealth (INDEPENDENT_AMBULATORY_CARE_PROVIDER_SITE_OTHER): Payer: PRIVATE HEALTH INSURANCE | Admitting: Family

## 2022-07-11 DIAGNOSIS — E782 Mixed hyperlipidemia: Secondary | ICD-10-CM | POA: Diagnosis not present

## 2022-07-11 DIAGNOSIS — R7303 Prediabetes: Secondary | ICD-10-CM

## 2022-07-11 DIAGNOSIS — D509 Iron deficiency anemia, unspecified: Secondary | ICD-10-CM

## 2022-07-11 DIAGNOSIS — E039 Hypothyroidism, unspecified: Secondary | ICD-10-CM | POA: Diagnosis not present

## 2022-07-11 MED ORDER — FERROUS SULFATE 325 (65 FE) MG PO TABS: 325.0000 mg | ORAL_TABLET | Freq: Every day | ORAL | 0 refills | Status: DC

## 2022-07-11 MED ORDER — LEVOTHYROXINE SODIUM 25 MCG PO TABS: 25.0000 ug | ORAL_TABLET | Freq: Every day | ORAL | 0 refills | Status: DC

## 2022-07-11 MED ORDER — DOCUSATE SODIUM 100 MG PO CAPS: 100.0000 mg | ORAL_CAPSULE | Freq: Every day | ORAL | 0 refills | Status: DC

## 2022-07-11 NOTE — Progress Notes (Signed)
This service is provided via telemedicine  No vital signs collected/recorded due to the encounter was a telemedicine visit.   Location of patient (ex: home, work):  Home  Patient consents to a telephone visit:  Yes  Location of the provider (ex: office, home):  [employer].  Name of any referring provider:  Antonio Creswell, Nelda Bucks, NP   Names of all persons participating in the telemedicine service and their role in the encounter:  Patient, Heriberto Antigua, [place], [place], Webb Silversmith, NP.    Time spent on call: 8 minutes spent on the phone with Medical Assistant.      Provider: Marlowe Sax FNP-C  Rosali Augello, Nelda Bucks, NP  Patient Care Team: Maryellen Dowdle, Nelda Bucks, NP as PCP - General (Family Medicine)  Extended Emergency Contact Information Primary Emergency Contact: Whittenberg,[name] Address: [address]          [place], [place] [address] [place] Mobile Phone: [phone] Relation: Spouse  Code Status:  DNR Goals of care: Advanced Directive information    07/11/2022    3:13 PM  Advanced Directives  Does Patient Have a Medical Advance Directive? No  Would patient like information on creating a medical advance directive? No - Patient declined     Chief Complaint  Patient presents with   Acute Visit    Discuss lab work.    HPI:  Pt is a 36 y.o. female seen today for an acute visit to discuss lab results.Lab done on 06/23/2022 [facility] CMA x 4 called patient but no answer so multiple voicemail was left for patient to return phone call.Patient viewed lab results on Mychart but could not understand so she came to office earlier in the morning though could not be seen due to provider's schedule being full in the morning.Pt scheduled for Video visit this afternoon.  Labs reviewed and discussed with patient.   Total cholesterol 238,TRG 109,LDL 162   TSH level 7.91 with normal T 3 and T 4 repeated TSH was 13.85.Reported increased weight gain and depression due to  finances on previous visit   Hgb A 1 C 5.9      Past Medical History:  Diagnosis Date   Alcohol use disorder in remission    Anemia    Former cigarette smoker    Hypertension    Pregnancy induced hypertension    Sleep apnea    Past Surgical History:  Procedure Laterality Date   CESAREAN SECTION     CESAREAN SECTION WITH BILATERAL TUBAL LIGATION  03/02/2014   Procedure: Repeat CESAREAN SECTION WITH BILATERAL TUBAL LIGATION;  Surgeon: Lahoma Crocker, MD;  Location: [facility];  Service: Obstetrics;;   DILATION AND CURETTAGE OF UTERUS     WISDOM TOOTH EXTRACTION      No Known Allergies  Outpatient Encounter Medications as of 07/11/2022  Medication Sig   losartan (COZAAR) 50 MG tablet TAKE 1 TABLET BY MOUTH EVERY DAY   meclizine (ANTIVERT) 25 MG tablet Take 1 tablet (25 mg total) by mouth 3 (three) times daily as needed for dizziness.   [DISCONTINUED] amLODipine (NORVASC) 5 MG tablet Take 1 tablet (5 mg total) by mouth daily. (Patient not taking: Reported on 04/07/2021)   [DISCONTINUED] valsartan (DIOVAN) 160 MG tablet Take 160 mg by mouth daily. (Patient not taking: Reported on 04/07/2021)   No facility-administered encounter medications on file as of 07/11/2022.    Review of Systems  Constitutional:  Negative for appetite change, chills, fatigue, fever and unexpected weight change.  Weight gain on previous visit   Eyes:  Negative for pain, discharge, redness, itching and visual disturbance.  Respiratory:  Negative for cough, chest tightness, shortness of breath and wheezing.   Cardiovascular:  Negative for chest pain, palpitations and leg swelling.  Gastrointestinal:  Negative for abdominal distention, abdominal pain, constipation, diarrhea, nausea and vomiting.  Endocrine: Negative for cold intolerance, heat intolerance, polydipsia, polyphagia and polyuria.  Musculoskeletal:  Negative for arthralgias, back pain, gait problem, joint swelling, myalgias, neck pain and neck  stiffness.  Skin:  Negative for color change, pallor and rash.  Neurological:  Negative for dizziness, syncope, speech difficulty, weakness, light-headedness, numbness and headaches.  Hematological:  Does not bruise/bleed easily.  Psychiatric/Behavioral:  Negative for agitation, behavioral problems, confusion, hallucinations, self-injury, sleep disturbance and suicidal ideas. The patient is not nervous/anxious.        Reported increased depression due to finances on previous visit     Immunization History  Administered Date(s) Administered   Influenza Inj Mdck Quad Pf 08/20/2018   PFIZER(Purple Top)SARS-COV-2 Vaccination 02/05/2020, 02/26/2020   Pertinent  Health Maintenance Due  Topic Date Due   PAP SMEAR-Modifier  08/07/2016   INFLUENZA VACCINE  06/27/2022      03/09/2022   11:02 AM 03/09/2022   10:49 PM 05/18/2022    1:40 PM 06/23/2022    9:59 AM 07/11/2022    3:13 PM  Fall Risk  Falls in the past year?   0 0 0  Was there an injury with Fall?   0 0 0  Fall Risk Category Calculator   0 0 0  Fall Risk Category   Low Low Low  Patient Fall Risk Level Low fall risk Low fall risk Low fall risk Low fall risk Low fall risk  Patient at Risk for Falls Due to   No Fall Risks No Fall Risks No Fall Risks  Fall risk Follow up   Falls evaluation completed Falls evaluation completed Falls evaluation completed   Functional Status Survey:    There were no vitals filed for this visit. There is no height or weight on file to calculate BMI. Physical Exam Constitutional:      General: She is not in acute distress.    Appearance: Normal appearance. She is normal weight. She is not ill-appearing.  Pulmonary:     Effort: No respiratory distress.  Neurological:     Mental Status: She is alert and oriented to person, place, and time.  Psychiatric:        Mood and Affect: Mood normal.        Speech: Speech normal.        Behavior: Behavior normal.    Labs reviewed: Recent Labs     02/24/22 1007 03/09/22 1051 06/23/22 1030  NA 138 138 139  K 4.4 3.5 4.3  CL 107 108 106  CO2 24 24 25   GLUCOSE 94 116* 102*  BUN 11 8 10   CREATININE 0.82 0.78 0.80  CALCIUM 9.0 8.6* 8.8   Recent Labs    01/21/22 2324 02/24/22 1007 06/23/22 1030  AST 17 11 13   ALT 16 10 11   ALKPHOS 58  --   --   BILITOT 0.3 0.3 0.3  PROT 6.6 6.6 7.0  ALBUMIN 3.5  --   --    Recent Labs    02/24/22 1007 03/09/22 1051 03/24/22 1012 06/23/22 1030  WBC 4.6 4.4 5.6 4.7  NEUTROABS 1,845  --  2,593 2,167  HGB 8.8* 8.6* 8.7* 8.4*  HCT  32.1* 32.0* 31.1* 30.9*  MCV 67.2* 67.8* 67.0* 65.7*  PLT 392 463* 448* 440*   Lab Results  Component Value Date   TSH 13.85 (H) 07/06/2022   Lab Results  Component Value Date   HGBA1C 5.9 (H) 07/06/2022   Lab Results  Component Value Date   CHOL 238 (H) 06/23/2022   HDL 53 06/23/2022   LDLCALC 162 (H) 06/23/2022   TRIG 109 06/23/2022   CHOLHDL 4.5 06/23/2022    Significant Diagnostic Results in last 30 days:  No results found.  Assessment/Plan  1. Acquired hypothyroidism Lab Results  Component Value Date   TSH 13.85 (H) 07/06/2022  Start on levothyroxine as below  Side effects discussed.Advised to take on empty stomach at least 2 hrs away from all othe medication and supplement.  - levothyroxine (SYNTHROID) 25 MCG tablet; Take 1 tablet (25 mcg total) by mouth daily before breakfast.  Dispense: 60 tablet; Refill: 0 - TSH; Future 8 weeks  - TSH; Future  2. Mixed hyperlipidemia LDL 162  Dietary modification and exercise advised   3. Iron deficiency anemia, unspecified iron deficiency anemia type Hgb 8.4 previous was 8.7 suspect due to her heavy Menses  She is in the process of looking for in network Gynecology states working with her Insurance - ferrous sulfate 325 (65 FE) MG tablet; Take 1 tablet (325 mg total) by mouth daily with breakfast.  Dispense: 60 tablet; Refill: 0 - docusate sodium (COLACE) 100 MG capsule; Take 1 capsule (100  mg total) by mouth daily. Take along with Ferrous sulfate  Dispense: 10 capsule; Refill: 0  - CBC with Differential/Platelet; Future 8 weeks  - CBC with Differential/Platelet; Future 4. Prediabetes Hgb A 1 C 5.9 ( 07/06/2022) Dietary modification and Exercise advised   - Hemoglobin A1c; Future - CMP with eGFR([facility]); Future   Family/ staff Communication: Reviewed plan of care with patient verbalized understanding.   Labs/tests ordered:  - CBC with Differential/Platelet; Future 8 weeks  - CBC with Differential/Platelet; Future  - Hemoglobin A1c; Future - CMP with eGFR([facility]); Future - TSH; Future 8 weeks  - TSH; Future - Lipid panel Future   Next Appointment: Return in about 5 months (around 12/06/2022) for fasting labs prior to visit. TSH and CBC in 8 weeks .  I connected with  Vicenta Dunning on 07/11/22 by a video enabled telemedicine application and verified that I am speaking with the correct person using two identifiers.   I discussed the limitations of evaluation and management by telemedicine. The patient expressed understanding and agreed to proceed.   Spent 18 minutes of face to face with patient  >50% time spent counseling; reviewing medical record; labs; and developing future plan of care.    Sandrea Hughs, NP

## 2022-07-11 NOTE — Patient Instructions (Signed)

## 2022-08-02 ENCOUNTER — Encounter: Payer: Self-pay | Admitting: Pulmonary Disease

## 2022-08-02 ENCOUNTER — Ambulatory Visit (INDEPENDENT_AMBULATORY_CARE_PROVIDER_SITE_OTHER): Payer: PRIVATE HEALTH INSURANCE | Admitting: Pulmonary Disease

## 2022-08-02 VITALS — BP 142/88 | HR 89 | Temp 98.2°F | Ht 66.0 in | Wt 331.8 lb

## 2022-08-02 DIAGNOSIS — G4733 Obstructive sleep apnea (adult) (pediatric): Secondary | ICD-10-CM

## 2022-08-02 NOTE — Patient Instructions (Signed)
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

## 2022-08-02 NOTE — Progress Notes (Signed)
[name]    [id number]    1986/11/04  Primary Care Physician:Ngetich, Donalee Citrin, NP  Referring Physician: Caesar Bookman, NP [address] [place],  [place] [address]  Chief complaint:   Evaluation for known sleep apnea  HPI:  Patient had a sleep study done about 2 years ago that revealed severe obstructive sleep apnea She was not started on any treatment  She is a Airline pilot and needed DOT recertification  Admits to snoring Usually goes to bed between 10 and 11, 10 to 15 minutes to fall asleep About 2 awakenings Final wake up time about 5:45 AM  Weight is up recently about 10 pounds  She has a history of hypertension which is well controlled Dad does snore  Denies any significant dryness of the mouth in the morning No morning headaches Memory is good Reformed smoker quit in 2022  She is a schoolbus driver  Outpatient Encounter Medications as of 08/02/2022  Medication Sig   ferrous sulfate 325 (65 FE) MG tablet Take 1 tablet (325 mg total) by mouth daily with breakfast.   levothyroxine (SYNTHROID) 25 MCG tablet Take 1 tablet (25 mcg total) by mouth daily before breakfast.   losartan (COZAAR) 50 MG tablet TAKE 1 TABLET BY MOUTH EVERY DAY   docusate sodium (COLACE) 100 MG capsule Take 1 capsule (100 mg total) by mouth daily. Take along with Ferrous sulfate (Patient not taking: Reported on 08/02/2022)   meclizine (ANTIVERT) 25 MG tablet Take 1 tablet (25 mg total) by mouth 3 (three) times daily as needed for dizziness. (Patient not taking: Reported on 08/02/2022)   [DISCONTINUED] amLODipine (NORVASC) 5 MG tablet Take 1 tablet (5 mg total) by mouth daily. (Patient not taking: Reported on 04/07/2021)   [DISCONTINUED] valsartan (DIOVAN) 160 MG tablet Take 160 mg by mouth daily. (Patient not taking: Reported on 04/07/2021)   No facility-administered encounter medications on file as of 08/02/2022.    Allergies as of 08/02/2022   (No Known Allergies)    Past  Medical History:  Diagnosis Date   Alcohol use disorder in remission    Anemia    Former cigarette smoker    Hypertension    Pregnancy induced hypertension    Sleep apnea     Past Surgical History:  Procedure Laterality Date   CESAREAN SECTION     CESAREAN SECTION WITH BILATERAL TUBAL LIGATION  03/02/2014   Procedure: Repeat CESAREAN SECTION WITH BILATERAL TUBAL LIGATION;  Surgeon: Antionette Char, MD;  Location: [facility];  Service: Obstetrics;;   DILATION AND CURETTAGE OF UTERUS     WISDOM TOOTH EXTRACTION      Family History  Problem Relation Age of Onset   Diabetes Mother    Congestive Heart Failure Mother    Hypertension Father     Social History   Socioeconomic History   Marital status: Single    Spouse name: Not on file   Number of children: Not on file   Years of education: Not on file   Highest education level: Not on file  Occupational History   Not on file  Tobacco Use   Smoking status: Former    Packs/day: 0.50    Types: Cigarettes   Smokeless tobacco: Never  Vaping Use   Vaping Use: Never used  Substance and Sexual Activity   Alcohol use: Yes    Comment: occas   Drug use: Never   Sexual activity: Not Currently  Birth control/protection: Abstinence, None, Surgical  Other Topics Concern   Not on file  Social History Narrative   Tobacco use, amount per day now: Quit in January 2023   Past tobacco use, amount per day: 6 to 7 daily.   How many years did you use tobacco: over 20 years.   Alcohol use (drinks per week): None   Diet:   Do you drink/eat things with caffeine: Occasionally.   Marital status:  Married                                What year were you married? 2015   Do you live in a house, apartment, assisted living, condo, trailer, etc.? House   Is it one or more stories? Two story.   How many persons live in your home? 5   Do you have pets in your home?( please list) No.   Highest Level of education completed? High School   Current or  past profession: Midwife   Do you exercise?  Yes                               Type and how often? Once a week.   Do you have a living will? No   Do you have a DNR form?   No                                If not, do you want to discuss one? Yes   Do you have signed POA/HPOA forms?  No                      If so, please bring to you appointment      Do you have any difficulty bathing or dressing yourself? No   Do you have any difficulty preparing food or eating? No   Do you have any difficulty managing your medications? No   Do you have any difficulty managing your finances? No   Do you have any difficulty affording your medications? No   Social Determinants of Corporate investment banker Strain: Not on file  Food Insecurity: Not on file  Transportation Needs: Not on file  Physical Activity: Not on file  Stress: Not on file  Social Connections: Not on file  Intimate Partner Violence: Not on file    Review of Systems  Constitutional:  Negative for fatigue.  Respiratory:  Positive for apnea.   Psychiatric/Behavioral:  Positive for sleep disturbance.     Vitals:   08/02/22 1327  BP: (!) 142/88  Pulse: 89  Temp: 98.2 F (36.8 C)  SpO2: 98%     Physical Exam Constitutional:      Appearance: She is obese.  HENT:     Head: Normocephalic.     Mouth/Throat:     Mouth: Mucous membranes are moist.     Comments: Mallampati 2, crowded oropharynx, relative macroglossia Cardiovascular:     Rate and Rhythm: Normal rate and regular rhythm.     Heart sounds: No murmur heard.    No friction rub.  Pulmonary:     Effort: No respiratory distress.     Breath sounds: No stridor. No wheezing or rhonchi.  Musculoskeletal:     Cervical back: No rigidity or tenderness.  Neurological:  Mental Status: She is alert.  Psychiatric:        Mood and Affect: Mood normal.       08/02/2022    1:31 PM  Results of the Epworth flowsheet  Sitting and reading 0  Watching TV 1  Sitting,  inactive in a public place (e.g. a theatre or a meeting) 0  As a passenger in a car for an hour without a break 0  Lying down to rest in the afternoon when circumstances permit 3  Sitting and talking to someone 0  Sitting quietly after a lunch without alcohol 0  In a car, while stopped for a few minutes in traffic 0  Total score 4   Data Reviewed: Previous study was not available for review today, patient remembers being told she has severe sleep apnea  Assessment:  History of severe obstructive sleep apnea-untreated  Class III obesity  Pathophysiology of sleep disordered breathing discussed with the patient  Treatment options for sleep disordered breathing discussed with the patient  Risks with having sleep apnea that is untreated discussed with the patient  Plan/Recommendations: Schedule patient for an in lab split-night study to ascertain and evaluate for treatment pressures required to treat severe obstructive sleep apnea  Encourage weight loss efforts  Severe obstructive sleep apnea that is untreated at present 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

## 2022-09-05 ENCOUNTER — Other Ambulatory Visit: Payer: PRIVATE HEALTH INSURANCE

## 2022-09-05 DIAGNOSIS — E039 Hypothyroidism, unspecified: Secondary | ICD-10-CM

## 2022-09-05 DIAGNOSIS — E782 Mixed hyperlipidemia: Secondary | ICD-10-CM

## 2022-09-05 DIAGNOSIS — D509 Iron deficiency anemia, unspecified: Secondary | ICD-10-CM

## 2022-09-05 DIAGNOSIS — R7303 Prediabetes: Secondary | ICD-10-CM

## 2022-09-06 ENCOUNTER — Other Ambulatory Visit: Payer: Self-pay | Admitting: Family

## 2022-09-06 DIAGNOSIS — E039 Hypothyroidism, unspecified: Secondary | ICD-10-CM

## 2022-09-06 LAB — CBC WITH DIFFERENTIAL/PLATELET
Absolute Monocytes: 371 cells/uL (ref 200–950)
Basophils Absolute: 41 cells/uL (ref 0–200)
Basophils Relative: 0.7 %
Eosinophils Absolute: 232 cells/uL (ref 15–500)
Eosinophils Relative: 4 %
HCT: 38.9 % (ref 35.0–45.0)
Hemoglobin: 11.4 g/dL — ABNORMAL LOW (ref 11.7–15.5)
Lymphs Abs: 2465 cells/uL (ref 850–3900)
MCH: 22.4 pg — ABNORMAL LOW (ref 27.0–33.0)
MCHC: 29.3 g/dL — ABNORMAL LOW (ref 32.0–36.0)
MCV: 76.6 fL — ABNORMAL LOW (ref 80.0–100.0)
MPV: 9.6 fL (ref 7.5–12.5)
Monocytes Relative: 6.4 %
Neutro Abs: 2691 cells/uL (ref 1500–7800)
Neutrophils Relative %: 46.4 %
Platelets: 421 10*3/uL — ABNORMAL HIGH (ref 140–400)
RBC: 5.08 10*6/uL (ref 3.80–5.10)
RDW: 24 % — ABNORMAL HIGH (ref 11.0–15.0)
Total Lymphocyte: 42.5 %
WBC: 5.8 10*3/uL (ref 3.8–10.8)

## 2022-09-06 LAB — COMPLETE METABOLIC PANEL WITH GFR
AG Ratio: 1.3 (calc) (ref 1.0–2.5)
ALT: 12 U/L (ref 6–29)
AST: 12 U/L (ref 10–30)
Albumin: 3.8 g/dL (ref 3.6–5.1)
Alkaline phosphatase (APISO): 55 U/L (ref 31–125)
BUN: 8 mg/dL (ref 7–25)
CO2: 28 mmol/L (ref 20–32)
Calcium: 9.1 mg/dL (ref 8.6–10.2)
Chloride: 104 mmol/L (ref 98–110)
Creat: 0.68 mg/dL (ref 0.50–0.97)
Globulin: 2.9 g/dL (calc) (ref 1.9–3.7)
Glucose, Bld: 101 mg/dL — ABNORMAL HIGH (ref 65–99)
Potassium: 4.3 mmol/L (ref 3.5–5.3)
Sodium: 139 mmol/L (ref 135–146)
Total Bilirubin: 0.2 mg/dL (ref 0.2–1.2)
Total Protein: 6.7 g/dL (ref 6.1–8.1)
eGFR: 114 mL/min/{1.73_m2} (ref >=60)

## 2022-09-06 LAB — LIPID PANEL
Cholesterol: 261 mg/dL — ABNORMAL HIGH (ref <200)
HDL: 56 mg/dL (ref >=50)
LDL Cholesterol (Calc): 178 mg/dL (calc) — ABNORMAL HIGH
Non-HDL Cholesterol (Calc): 205 mg/dL (calc) — ABNORMAL HIGH (ref <130)
Total CHOL/HDL Ratio: 4.7 (calc) (ref <5.0)
Triglycerides: 130 mg/dL (ref <150)

## 2022-09-06 LAB — HEMOGLOBIN A1C
Hgb A1c MFr Bld: 5.6 % of total Hgb (ref <5.7)
Mean Plasma Glucose: 114 mg/dL
eAG (mmol/L): 6.3 mmol/L

## 2022-09-06 LAB — TSH: TSH: 9.6 mIU/L — ABNORMAL HIGH

## 2022-09-08 ENCOUNTER — Other Ambulatory Visit: Payer: Self-pay | Admitting: Family

## 2022-09-08 DIAGNOSIS — D509 Iron deficiency anemia, unspecified: Secondary | ICD-10-CM

## 2022-09-08 NOTE — Telephone Encounter (Signed)
Patient is requesting refill on medication Ferrous Sulfate. Last refill dated 07/11/2022. Patient was given 60 tablets with 0 refills. I'm unsure if patient needs to continue taking this medication. Medication pend and sent to PCP Ngetich, Nelda Bucks, NP for approval.

## 2022-09-12 ENCOUNTER — Other Ambulatory Visit: Payer: Self-pay

## 2022-09-12 DIAGNOSIS — E782 Mixed hyperlipidemia: Secondary | ICD-10-CM

## 2022-09-12 DIAGNOSIS — E039 Hypothyroidism, unspecified: Secondary | ICD-10-CM

## 2022-09-12 DIAGNOSIS — R7989 Other specified abnormal findings of blood chemistry: Secondary | ICD-10-CM

## 2022-09-12 MED ORDER — SIMVASTATIN 10 MG PO TABS: 10.0000 mg | ORAL_TABLET | ORAL | 1 refills | Status: DC

## 2022-09-12 MED ORDER — LEVOTHYROXINE SODIUM 50 MCG PO TABS: 50.0000 ug | ORAL_TABLET | Freq: Every day | ORAL | 1 refills | Status: DC

## 2022-09-20 ENCOUNTER — Ambulatory Visit (HOSPITAL_BASED_OUTPATIENT_CLINIC_OR_DEPARTMENT_OTHER): Payer: PRIVATE HEALTH INSURANCE | Attending: Pulmonary Disease | Admitting: Pulmonary Disease

## 2022-09-20 DIAGNOSIS — G4733 Obstructive sleep apnea (adult) (pediatric): Secondary | ICD-10-CM | POA: Insufficient documentation

## 2022-10-01 ENCOUNTER — Telehealth: Payer: Self-pay | Admitting: Pulmonary Disease

## 2022-10-01 NOTE — Procedures (Signed)
POLYSOMNOGRAPHY  Last, First: [name], [name] MRN: [id number] Gender: Female Age (years): 36 Weight (lbs): 333 DOB: 06/25/1986 BMI: 54 Primary Care: No PCP Epworth Score: 8 Referring: Laurin Coder MD Technician: Carolin Coy Interpreting: Laurin Coder MD Study Type: Split Night CPAP Ordered Study Type: Split Night CPAP Study date: 09/20/2022 Location: [facility] CLINICAL INFORMATION [name] is a 36 year old Female and was referred to the sleep center for evaluation of G47.30 Sleep Apnea, Unspecified (780.57). Indications include Excessive Daytime Sleepiness, Fatigue, Hypertension, Obesity, Snoring.  MEDICATIONS Patient self administered medications include: SIMVASTATIN. Medications administered during study include No sleep medicine administered.  SLEEP STUDY TECHNIQUE The patient underwent an attended overnight level one polysomnography titration to assess the effects of CPAP therapy. The following variables were monitored: EEG (C4-A1, C3-A2, O1-A2, O2-A1), EOG, submental and leg EMG, ECG, oxyhemoglobin saturation by pulse oximetry, thoracic and abdominal respiratory effort belts, nasal/oral airflow by pressure sensor, body position sensor and snoring sensor. CPAP pressure was titrated to eliminate apneas, hypopneas and oxygen desaturation. Hypopneas were scored per AASM definition IB (4% desaturation)  The NPSG portion of the study ended at 1:04:13 AM . The CPAP titration was initiated at 1:06:59 AM AM with the CPAP portion of the study ending at 4:47:19 AM.  TECHNICIAN COMMENTS Comments added by Technician: PATIENT WAS ORDERED AS A SPLIT NIGHT STUDY Comments added by Scorer: N/A SLEEP ARCHITECTURE The recording time for the entire night was 410 minutes. The diagnostic portion was initiated at 9:57:16 PM and terminated at 1:04:13 AM. The time in bed was 186.9 minutes. EEG confirmed total sleep time was 129.1 minutes yielding a sleep efficiency of 69.1%%. Sleep  onset after lights out was 16.4 minutes with a REM latency of N/A minutes. The patient spent 10.8%% of the night in stage N1 sleep, 89.2%% in stage N2 sleep, 0.0%% in stage N3 and 0% in REM. The Arousal Index was 111.5/hour.  The titration portion was initiated at 1:06:59 AM and terminated at 4:47:19 AM. The time in bed was 220.3 minutes. EEG confirmed total sleep time was 179.5 minutes yielding a sleep efficiency of 81.5%%. Sleep onset after CPAP initiation was 39.1 minutes with a REM latency of 28.0 minutes. The patient spent 8.1%% of the night in stage N1 sleep, 77.2%% in stage N2 sleep, 0.0%% in stage N3 and 14.8% in REM. The Arousal Index was 21.4/hour. RESPIRATORY PARAMETERS During the diagnostic portion, there were a total of 285 respiratory disturbances recorded; 0 apneas ( 0 obstructive, 0 mixed, 0 central), 214 hypopneas and 71 RERAs. The apnea/hypopnea index 99.5 was events/hour and the RDI was 132.5 events/hour. The central sleep apnea index was 0.0 events/hour. The REM AHI was N/A /h and NREM AHI was 99.5/h. The REM RDI was 0.0 /h and NREM RDI was 132.5 /h. The supine AHI was N/A/h, and the non supine AHI was 99.5/h; supine during 0.0%% of sleep. The supine RDI was 0.0/h, and the non supine RDI was 132.93/h. Respiratory disturbances were associated with oxygen desaturation down to a nadir of 81.0 % during sleep. The mean oxygen saturation during the study was 94.7%. The cumulative time under 88% oxygen saturation was 4.7 minutes.  During the titration portion, the apnea/hypopnea index (AHI) was 13.7 events/hour and the RDI was 15.4 events/hour. The central sleep apnea index was events/hour. The most appropriate setting of CPAP was IPAP/EPAP 14/14 cm H2O. At this setting, the sleep efficiency was 99% and the patient was supine for 100%. The AHI was 0 events  per hour(with 0 central events). Oxygen nadir was 93.0. LEG MOVEMENT DATA The periodic limb movement index was 0.0/hour with an associated  arousal index of /hour. CARDIAC DATA The underlying cardiac rhythm was most consistent with sinus rhythm. Mean heart rate was 76.0 during diagnostic portion and 82.1 during titration portion of study. Additional rhythm abnormalities include None.  IMPRESSIONS - Severe Obstructive Sleep apnea(OSA) Optimal pressure attained. - EKG showed no cardiac abnormalities. - Moderate Oxygen Desaturation - The patient snored with loud snoring volume. - EEG did not show alpha intrusion. - No significant periodic leg movements(PLMs) during sleep. However, no significant associated arousals. - Reduced sleep efficiency, normal primary sleep latency, long REM sleep latency and no slow wave latency.  DIAGNOSIS - Obstructive Sleep Apnea (G47.33)  RECOMMENDATIONS - Trial of CPAP therapy on 12 cm H2O with a Extra Small size Fisher[employer]Paykel Full Face Evora Full mask and heated humidification. - Avoid alcohol, sedatives and other CNS depressants that may worsen sleep apnea and disrupt normal sleep architecture. - Sleep hygiene should be reviewed to assess factors that may improve sleep quality. - Weight management and regular exercise should be initiated or continued. -  [Electronically signed] 10/01/2022 07:16 PM  Sherrilyn Rist MD NPI: [id number]

## 2022-10-01 NOTE — Telephone Encounter (Signed)
Call patient  Sleep study result  Date of study: 09/20/2022  Impression: Severe obstructive sleep apnea  Recommendation: DME referral  Trial of CPAP therapy on 12 cm H2O with a Extra Small size [employer]Paykel Full Face Evora Full mask and heated humidification.  Encourage weight loss measures  Follow-up in the office 4 to 6 weeks following initiation of treatment

## 2022-10-02 NOTE — Telephone Encounter (Signed)
ATC patient but mailbox is full. Will try again.  

## 2022-10-03 NOTE — Telephone Encounter (Signed)
Mychart message sent d/t patient mailbox being full.  ATC x2.  VM full.

## 2022-10-09 ENCOUNTER — Encounter: Payer: Self-pay | Admitting: *Deleted

## 2022-10-09 NOTE — Telephone Encounter (Signed)
Letter mailed to the pt to call for results

## 2022-10-11 ENCOUNTER — Telehealth: Payer: Self-pay | Admitting: Pulmonary Disease

## 2022-10-11 DIAGNOSIS — G4733 Obstructive sleep apnea (adult) (pediatric): Secondary | ICD-10-CM

## 2022-10-11 NOTE — Telephone Encounter (Signed)
Tabitha Lightning, MD      10/01/22  7:18 PM Note Call patient   Sleep study result   Date of study: 09/20/2022   Impression: Severe obstructive sleep apnea   Recommendation: DME referral   Trial of CPAP therapy on 12 cm H2O with a Extra Small size [employer]Paykel Full Face Evora Full mask and heated humidification.   Encourage weight loss measures   Follow-up in the office 4 to 6 weeks following initiation of treatment      Called and spoke with pt letting her know the results of HST and she verbalized understanding. Order for cpap start has been placed. Pt has been told to call the office once she receives CPAP to schedule a f/u appt 31-90 days after receiving CPAP. Nothing further needed.

## 2022-10-16 ENCOUNTER — Other Ambulatory Visit: Payer: PRIVATE HEALTH INSURANCE

## 2022-10-16 DIAGNOSIS — R7989 Other specified abnormal findings of blood chemistry: Secondary | ICD-10-CM

## 2022-10-16 DIAGNOSIS — E039 Hypothyroidism, unspecified: Secondary | ICD-10-CM

## 2022-10-17 ENCOUNTER — Other Ambulatory Visit: Payer: Self-pay

## 2022-10-17 ENCOUNTER — Other Ambulatory Visit: Payer: Self-pay | Admitting: Family

## 2022-10-17 DIAGNOSIS — D509 Iron deficiency anemia, unspecified: Secondary | ICD-10-CM

## 2022-10-17 DIAGNOSIS — E782 Mixed hyperlipidemia: Secondary | ICD-10-CM

## 2022-10-17 DIAGNOSIS — I10 Essential (primary) hypertension: Secondary | ICD-10-CM

## 2022-10-17 DIAGNOSIS — E039 Hypothyroidism, unspecified: Secondary | ICD-10-CM

## 2022-10-17 DIAGNOSIS — R7303 Prediabetes: Secondary | ICD-10-CM

## 2022-10-17 LAB — TSH: TSH: 8.76 mIU/L — ABNORMAL HIGH

## 2022-10-17 MED ORDER — LEVOTHYROXINE SODIUM 75 MCG PO TABS: 75.0000 ug | ORAL_TABLET | Freq: Every day | ORAL | 1 refills | Status: DC

## 2022-10-17 NOTE — Progress Notes (Signed)
Lab work ordered

## 2022-10-25 ENCOUNTER — Ambulatory Visit: Payer: PRIVATE HEALTH INSURANCE | Admitting: Pulmonary Disease

## 2022-11-08 ENCOUNTER — Other Ambulatory Visit: Payer: Self-pay

## 2022-11-08 ENCOUNTER — Ambulatory Visit: Payer: PRIVATE HEALTH INSURANCE

## 2022-11-22 ENCOUNTER — Encounter (HOSPITAL_BASED_OUTPATIENT_CLINIC_OR_DEPARTMENT_OTHER): Payer: Self-pay | Admitting: Emergency Medicine

## 2022-11-22 ENCOUNTER — Other Ambulatory Visit: Payer: Self-pay

## 2022-11-22 ENCOUNTER — Emergency Department (HOSPITAL_BASED_OUTPATIENT_CLINIC_OR_DEPARTMENT_OTHER)
Admission: EM | Admit: 2022-11-22 | Discharge: 2022-11-22 | Disposition: A | Discharge status: Home / Self Care | Payer: PRIVATE HEALTH INSURANCE | Attending: Emergency Medicine | Admitting: Emergency Medicine

## 2022-11-22 DIAGNOSIS — J029 Acute pharyngitis, unspecified: Secondary | ICD-10-CM | POA: Insufficient documentation

## 2022-11-22 DIAGNOSIS — I10 Essential (primary) hypertension: Secondary | ICD-10-CM | POA: Diagnosis not present

## 2022-11-22 DIAGNOSIS — Z79899 Other long term (current) drug therapy: Secondary | ICD-10-CM | POA: Insufficient documentation

## 2022-11-22 DIAGNOSIS — Z87891 Personal history of nicotine dependence: Secondary | ICD-10-CM | POA: Diagnosis not present

## 2022-11-22 DIAGNOSIS — Z1152 Encounter for screening for COVID-19: Secondary | ICD-10-CM | POA: Insufficient documentation

## 2022-11-22 DIAGNOSIS — R070 Pain in throat: Secondary | ICD-10-CM | POA: Diagnosis present

## 2022-11-22 LAB — RESP PANEL BY RT-PCR (RSV, FLU A&B, COVID)  RVPGX2
Influenza A by PCR: NEGATIVE
Influenza B by PCR: NEGATIVE
Resp Syncytial Virus by PCR: NEGATIVE
SARS Coronavirus 2 by RT PCR: NEGATIVE

## 2022-11-22 LAB — GROUP A STREP BY PCR: Group A Strep by PCR: NOT DETECTED

## 2022-11-22 NOTE — Discharge Instructions (Addendum)
You tested negative for flu, covid and strep today.  You can try Mucinex or Theraflu for symptom relief.  Gargle with salt water. Please take tylenol/ibuprofen for pain. I recommend close follow-up with PCP for reevaluation.  Please do not hesitate to return to emergency department if worrisome signs symptoms we discussed become apparent.

## 2022-11-22 NOTE — ED Provider Notes (Signed)
[facility] EMERGENCY DEPT Provider Note   CSN: [id number] Arrival date & time: 11/22/22  1110     History  Chief Complaint  Patient presents with   Sore Throat    [name] is a 36 y.o. female with a past medical history of anemia, hypertension presenting to the emergency department for evaluation of sore throat.  Patient reports she began to have sore throat on 12/23.  No fever, cough, runny nose, congestion.  Denies any neck swelling.  She reports the pain got worse that make it difficult for her to swallow.  No chest pain or shortness of breath.   Sore Throat      Past Medical History:  Diagnosis Date   Alcohol use disorder in remission    Anemia    Former cigarette smoker    Hypertension    Pregnancy induced hypertension    Sleep apnea    Past Surgical History:  Procedure Laterality Date   CESAREAN SECTION     CESAREAN SECTION WITH BILATERAL TUBAL LIGATION  03/02/2014   Procedure: Repeat CESAREAN SECTION WITH BILATERAL TUBAL LIGATION;  Surgeon: Antionette Char, MD;  Location: [facility];  Service: Obstetrics;;   DILATION AND CURETTAGE OF UTERUS     WISDOM TOOTH EXTRACTION       Home Medications Prior to Admission medications   Medication Sig Start Date End Date Taking? Authorizing Provider  levothyroxine (SYNTHROID) 75 MCG tablet Take 1 tablet (75 mcg total) by mouth daily. Take on an empty stomach 2 hours away from other medications 10/17/22   Ngetich, Dinah C, NP  docusate sodium (COLACE) 100 MG capsule Take 1 capsule (100 mg total) by mouth daily. Take along with Ferrous sulfate Patient not taking: Reported on 08/02/2022 07/11/22   Ngetich, Dinah C, NP  ferrous sulfate 325 (65 FE) MG tablet TAKE 1 TABLET BY MOUTH EVERY DAY WITH BREAKFAST 09/08/22   Ngetich, Dinah C, NP  losartan (COZAAR) 50 MG tablet TAKE 1 TABLET BY MOUTH EVERY DAY 05/22/22   Ngetich, Dinah C, NP  meclizine (ANTIVERT) 25 MG tablet Take 1 tablet (25 mg total) by mouth 3 (three)  times daily as needed for dizziness. Patient not taking: Reported on 08/02/2022 03/10/22   Smoot, Shawn Route, PA-C  simvastatin (ZOCOR) 10 MG tablet Take 1 tablet (10 mg total) by mouth as directed. 1/2 tablet x 4 weeks then increase to 1 tablet by mouth daily 09/12/22   Ngetich, Dinah C, NP  amLODipine (NORVASC) 5 MG tablet Take 1 tablet (5 mg total) by mouth daily. Patient not taking: Reported on 04/07/2021 07/08/18 04/07/21  Little, Ambrose Finland, MD  valsartan (DIOVAN) 160 MG tablet Take 160 mg by mouth daily. Patient not taking: Reported on 04/07/2021 01/11/21 04/07/21  [provider]      Allergies    Patient has no known allergies.    Review of Systems   Review of Systems Negative except as per HPI.  Physical Exam Updated Vital Signs BP (!) 143/102   Pulse 96   Temp 97.9 F (36.6 C)   Resp (!) 24   Ht 5\' 6"  (1.676 m)   Wt (!) 151 kg   SpO2 100%   BMI 53.73 kg/m  Physical Exam Vitals and nursing note reviewed.  Constitutional:      Appearance: Normal appearance.  HENT:     Head: Normocephalic and atraumatic.     Mouth/Throat:     Mouth: Mucous membranes are moist.     Tonsils: No  tonsillar exudate or tonsillar abscesses. 1+ on the right. 1+ on the left.  Eyes:     General: No scleral icterus. Cardiovascular:     Rate and Rhythm: Normal rate and regular rhythm.     Pulses: Normal pulses.     Heart sounds: Normal heart sounds.  Pulmonary:     Effort: Pulmonary effort is normal.     Breath sounds: Normal breath sounds.  Abdominal:     General: Abdomen is flat.     Palpations: Abdomen is soft.     Tenderness: There is no abdominal tenderness.  Musculoskeletal:        General: No deformity.  Skin:    General: Skin is warm.     Findings: No rash.  Neurological:     General: No focal deficit present.     Mental Status: She is alert.  Psychiatric:        Mood and Affect: Mood normal.     ED Results / Procedures / Treatments   Labs (all labs ordered are  listed, but only abnormal results are displayed) Labs Reviewed  RESP PANEL BY RT-PCR (RSV, FLU A&B, COVID)  [id number]  GROUP A STREP BY PCR    EKG None  Radiology No results found.  Procedures Procedures    Medications Ordered in ED Medications - No data to display  ED Course/ Medical Decision Making/ A&P                           Medical Decision Making  This patient presents to the ED for sore throat, this involves an extensive number of treatment options, and is a complaint that carries with a high risk of complications and morbidity.  The differential diagnosis includes flu, COVID, RSV, strep, pharyngitis, bronchitis, pneumonia, infectious etiology.  This is not an exhaustive list.  Lab tests: I ordered and personally interpreted labs.  The pertinent results include: Viral panel positive for flu A.  Imaging studies:  Problem list/ ED course/ Critical interventions/ Medical management: HPI: See above Vital signs within normal range and stable throughout visit. Laboratory/imaging studies significant for: See above. On physical examination, patient is afebrile and appears in no acute distress. This patient presents with symptoms suspicious for URI. Based on history and physical doubt sinusitis. COVID test was negative. Do not suspect underlying cardiopulmonary process. I considered, but think unlikely, pneumonia. Patient is nontoxic appearing and not in need of emergent medical intervention. Patient told to self isolate at home until symptoms subside for 72 hours.  Recommended patient to take TheraFlu or Mucinex for symptom relief.  Follow-up with primary care physician for further evaluation and management.  Return to the ER if new or worsening symptoms. I have reviewed the patient home medicines and have made adjustments as needed.  Cardiac monitoring/EKG: The patient was maintained on a cardiac monitor.  I personally reviewed and interpreted the cardiac monitor which showed an  underlying rhythm of: sinus rhythm.  Additional history obtained: External records from outside source obtained and reviewed including: Chart review including previous notes, labs, imaging.  Consultations obtained:  Disposition Continued outpatient therapy. Follow-up with PCP recommended for reevaluation of symptoms. Treatment plan discussed with patient.  Pt acknowledged understanding was agreeable to the plan. Worrisome signs and symptoms were discussed with patient, and patient acknowledged understanding to return to the ED if they noticed these signs and symptoms. Patient was stable upon discharge.   This chart was dictated using voice  recognition software.  Despite best efforts to proofread,  errors can occur which can change the documentation meaning.          Final Clinical Impression(s) / ED Diagnoses Final diagnoses:  Sore throat    Rx / DC Orders ED Discharge Orders     None         Jeanelle Malling, [place] 11/22/22 2342    Jacalyn Lefevre, MD 11/29/22 [phone]

## 2022-11-22 NOTE — ED Triage Notes (Signed)
Pt arrives to ED with c/o sore throat that started on 12/23.

## 2022-12-20 ENCOUNTER — Other Ambulatory Visit: Payer: PRIVATE HEALTH INSURANCE

## 2022-12-20 DIAGNOSIS — D509 Iron deficiency anemia, unspecified: Secondary | ICD-10-CM

## 2022-12-20 DIAGNOSIS — E782 Mixed hyperlipidemia: Secondary | ICD-10-CM

## 2022-12-20 DIAGNOSIS — E039 Hypothyroidism, unspecified: Secondary | ICD-10-CM

## 2022-12-20 DIAGNOSIS — I10 Essential (primary) hypertension: Secondary | ICD-10-CM

## 2022-12-20 DIAGNOSIS — R7303 Prediabetes: Secondary | ICD-10-CM

## 2022-12-21 LAB — COMPLETE METABOLIC PANEL WITH GFR
AG Ratio: 1.4 (calc) (ref 1.0–2.5)
ALT: 13 U/L (ref 6–29)
AST: 15 U/L (ref 10–30)
Albumin: 4 g/dL (ref 3.6–5.1)
Alkaline phosphatase (APISO): 56 U/L (ref 31–125)
BUN: 10 mg/dL (ref 7–25)
CO2: 24 mmol/L (ref 20–32)
Calcium: 8.9 mg/dL (ref 8.6–10.2)
Chloride: 106 mmol/L (ref 98–110)
Creat: 0.69 mg/dL (ref 0.50–0.97)
Globulin: 2.8 g/dL (calc) (ref 1.9–3.7)
Glucose, Bld: 101 mg/dL — ABNORMAL HIGH (ref 65–99)
Potassium: 4.4 mmol/L (ref 3.5–5.3)
Sodium: 139 mmol/L (ref 135–146)
Total Bilirubin: 0.3 mg/dL (ref 0.2–1.2)
Total Protein: 6.8 g/dL (ref 6.1–8.1)
eGFR: 114 mL/min/{1.73_m2} (ref >=60)

## 2022-12-21 LAB — CBC WITH DIFFERENTIAL/PLATELET
Absolute Monocytes: 326 cells/uL (ref 200–950)
Basophils Absolute: 38 cells/uL (ref 0–200)
Basophils Relative: 0.8 %
Eosinophils Absolute: 139 cells/uL (ref 15–500)
Eosinophils Relative: 2.9 %
HCT: 39.7 % (ref 35.0–45.0)
Hemoglobin: 12.7 g/dL (ref 11.7–15.5)
Lymphs Abs: 2155 cells/uL (ref 850–3900)
MCH: 26.1 pg — ABNORMAL LOW (ref 27.0–33.0)
MCHC: 32 g/dL (ref 32.0–36.0)
MCV: 81.5 fL (ref 80.0–100.0)
MPV: 9.7 fL (ref 7.5–12.5)
Monocytes Relative: 6.8 %
Neutro Abs: 2141 cells/uL (ref 1500–7800)
Neutrophils Relative %: 44.6 %
Platelets: 313 10*3/uL (ref 140–400)
RBC: 4.87 10*6/uL (ref 3.80–5.10)
RDW: 15.2 % — ABNORMAL HIGH (ref 11.0–15.0)
Total Lymphocyte: 44.9 %
WBC: 4.8 10*3/uL (ref 3.8–10.8)

## 2022-12-21 LAB — LIPID PANEL
Cholesterol: 203 mg/dL — ABNORMAL HIGH (ref <200)
HDL: 52 mg/dL (ref >=50)
LDL Cholesterol (Calc): 130 mg/dL (calc) — ABNORMAL HIGH
Non-HDL Cholesterol (Calc): 151 mg/dL (calc) — ABNORMAL HIGH (ref <130)
Total CHOL/HDL Ratio: 3.9 (calc) (ref <5.0)
Triglycerides: 100 mg/dL (ref <150)

## 2022-12-21 LAB — TSH: TSH: 4.11 mIU/L

## 2022-12-21 LAB — HEMOGLOBIN A1C
Hgb A1c MFr Bld: 6.5 % of total Hgb — ABNORMAL HIGH (ref <5.7)
Mean Plasma Glucose: 140 mg/dL
eAG (mmol/L): 7.7 mmol/L

## 2022-12-25 ENCOUNTER — Other Ambulatory Visit: Payer: Self-pay | Admitting: Family

## 2022-12-25 ENCOUNTER — Encounter: Payer: Self-pay | Admitting: Family

## 2022-12-25 ENCOUNTER — Ambulatory Visit (INDEPENDENT_AMBULATORY_CARE_PROVIDER_SITE_OTHER): Payer: PRIVATE HEALTH INSURANCE | Admitting: Family

## 2022-12-25 VITALS — BP 136/82 | HR 83 | Temp 97.7°F | Resp 18 | Ht 66.0 in | Wt 345.6 lb

## 2022-12-25 DIAGNOSIS — E039 Hypothyroidism, unspecified: Secondary | ICD-10-CM | POA: Diagnosis not present

## 2022-12-25 DIAGNOSIS — D509 Iron deficiency anemia, unspecified: Secondary | ICD-10-CM

## 2022-12-25 DIAGNOSIS — Z6841 Body Mass Index (BMI) 40.0 and over, adult: Secondary | ICD-10-CM

## 2022-12-25 DIAGNOSIS — I10 Essential (primary) hypertension: Secondary | ICD-10-CM

## 2022-12-25 DIAGNOSIS — E782 Mixed hyperlipidemia: Secondary | ICD-10-CM

## 2022-12-25 DIAGNOSIS — E1169 Type 2 diabetes mellitus with other specified complication: Secondary | ICD-10-CM

## 2022-12-25 NOTE — Progress Notes (Signed)
Provider: Richarda Blade FNP-C   Berdena Cisek, Donalee Citrin, NP  Patient Care Team: Roxene Alviar, Donalee Citrin, NP as PCP - General (Family Medicine) Philip Aspen, DO as Consulting Physician (Obstetrics and Gynecology)  Extended Emergency Contact Information Primary Emergency Contact: Carolynne Edouard Address: [address]          [place], [place] [address] [place] Mobile Phone: [phone] Relation: Spouse  Code Status:  Full Code  Goals of care: Advanced Directive information    12/25/2022   10:01 AM  Advanced Directives  Does Patient Have a Medical Advance Directive? No  Would patient like information on creating a medical advance directive? No - Patient declined     Chief Complaint  Patient presents with   Medical Management of Chronic Issues    6 month follow up.    Health Maintenance    Discuss the need for Pap smear.    Immunizations    Discuss the need for DTAP vaccine, and Covid Booster.     HPI:  Pt is a 37 y.o. female seen today for 6 months follow up for medical management of chronic diseases. Recent lab results reviewed and discussed during visit.    She denies any acute issues.  Hyperlipidemia - LDL 130 ,chol 203   A1C 6.5 states recent started changing her diet eating more lean meat,fruits and veggies  Due for Tdap and COVID -19 booster vaccine.aware to get vaccine at the Pharmacy.  Past Medical History:  Diagnosis Date   Alcohol use disorder in remission    Anemia    Former cigarette smoker    Hypertension    Pregnancy induced hypertension    Sleep apnea    Past Surgical History:  Procedure Laterality Date   CESAREAN SECTION     CESAREAN SECTION WITH BILATERAL TUBAL LIGATION  03/02/2014   Procedure: Repeat CESAREAN SECTION WITH BILATERAL TUBAL LIGATION;  Surgeon: Antionette Char, MD;  Location: [facility];  Service: Obstetrics;;   DILATION AND CURETTAGE OF UTERUS     WISDOM TOOTH EXTRACTION      No Known Allergies  Allergies as of  12/25/2022   No Known Allergies      Medication List        Accurate as of December 25, 2022 10:34 AM. If you have any questions, ask your nurse or doctor.          STOP taking these medications    docusate sodium 100 MG capsule Commonly known as: Colace Stopped by: Caesar Bookman, NP   meclizine 25 MG tablet Commonly known as: ANTIVERT Stopped by: Caesar Bookman, NP       TAKE these medications    ferrous sulfate 325 (65 FE) MG tablet TAKE 1 TABLET BY MOUTH EVERY DAY WITH BREAKFAST   levothyroxine 75 MCG tablet Commonly known as: SYNTHROID Take 1 tablet (75 mcg total) by mouth daily. Take on an empty stomach 2 hours away from other medications   losartan 50 MG tablet Commonly known as: COZAAR TAKE 1 TABLET BY MOUTH EVERY DAY   simvastatin 10 MG tablet Commonly known as: ZOCOR Take 1 tablet (10 mg total) by mouth as directed. 1/2 tablet x 4 weeks then increase to 1 tablet by mouth daily        Review of Systems  Constitutional:  Negative for appetite change, chills, fatigue, fever and unexpected weight change.  HENT:  Negative for congestion, dental problem, ear discharge, ear pain, facial swelling, hearing loss, nosebleeds, postnasal drip, rhinorrhea,  sinus pressure, sinus pain, sneezing, sore throat, tinnitus and trouble swallowing.   Eyes:  Negative for pain, discharge, redness, itching and visual disturbance.  Respiratory:  Negative for cough, chest tightness, shortness of breath and wheezing.   Cardiovascular:  Negative for chest pain, palpitations and leg swelling.  Gastrointestinal:  Negative for abdominal distention, abdominal pain, blood in stool, constipation, diarrhea, nausea and vomiting.  Endocrine: Negative for cold intolerance, heat intolerance, polydipsia, polyphagia and polyuria.  Genitourinary:  Negative for difficulty urinating, dysuria, flank pain, frequency and urgency.  Musculoskeletal:  Negative for arthralgias, back pain, gait problem,  joint swelling, myalgias, neck pain and neck stiffness.  Skin:  Negative for color change, pallor, rash and wound.  Neurological:  Negative for dizziness, syncope, speech difficulty, weakness, light-headedness, numbness and headaches.  Hematological:  Does not bruise/bleed easily.  Psychiatric/Behavioral:  Negative for agitation, behavioral problems, confusion, hallucinations, self-injury, sleep disturbance and suicidal ideas. The patient is not nervous/anxious.     Immunization History  Administered Date(s) Administered   Influenza Inj Mdck Quad Pf 08/20/2018   Influenza-Unspecified 08/27/2022   PFIZER(Purple Top)SARS-COV-2 Vaccination 02/05/2020, 02/26/2020   Td (Adult),5 Lf Tetanus Toxid, Preservative Free 09/28/2022   Pertinent  Health Maintenance Due  Topic Date Due   PAP SMEAR-Modifier  08/07/2016   INFLUENZA VACCINE  Completed      05/18/2022    1:40 PM 06/23/2022    9:59 AM 07/11/2022    3:13 PM 11/22/2022   12:30 PM 12/25/2022   10:01 AM  Fall Risk  Falls in the past year? 0 0 0  0  Was there an injury with Fall? 0 0 0  0  Fall Risk Category Calculator 0 0 0  0  Fall Risk Category (Retired) Low Low Low    (RETIRED) Patient Fall Risk Level Low fall risk Low fall risk Low fall risk Low fall risk   Patient at Risk for Falls Due to No Fall Risks No Fall Risks No Fall Risks  History of fall(s)  Fall risk Follow up Falls evaluation completed Falls evaluation completed Falls evaluation completed  Falls evaluation completed   Functional Status Survey:    Vitals:   12/25/22 0959  BP: 136/82  Pulse: 83  Resp: 18  Temp: 97.7 F (36.5 C)  SpO2: 96%  Weight: (!) 345 lb 9.6 oz (156.8 kg)  Height: 5\' 6"  (1.676 m)   Body mass index is 55.78 kg/m. Physical Exam Vitals reviewed.  Constitutional:      General: She is not in acute distress.    Appearance: Normal appearance. She is obese. She is not ill-appearing or diaphoretic.  HENT:     Head: Normocephalic.     Right Ear:  Tympanic membrane, ear canal and external ear normal. There is no impacted cerumen.     Left Ear: Tympanic membrane, ear canal and external ear normal. There is no impacted cerumen.     Nose: Nose normal. No congestion or rhinorrhea.     Mouth/Throat:     Mouth: Mucous membranes are moist.     Pharynx: Oropharynx is clear. No oropharyngeal exudate or posterior oropharyngeal erythema.  Eyes:     General: No scleral icterus.       Right eye: No discharge.        Left eye: No discharge.     Extraocular Movements: Extraocular movements intact.     Conjunctiva/sclera: Conjunctivae normal.     Pupils: Pupils are equal, round, and reactive to light.  Neck:  Vascular: No carotid bruit.  Cardiovascular:     Rate and Rhythm: Normal rate and regular rhythm.     Pulses: Normal pulses.     Heart sounds: Normal heart sounds. No murmur heard.    No friction rub. No gallop.  Pulmonary:     Effort: Pulmonary effort is normal. No respiratory distress.     Breath sounds: Normal breath sounds. No wheezing, rhonchi or rales.  Chest:     Chest wall: No tenderness.  Abdominal:     General: Bowel sounds are normal. There is no distension.     Palpations: Abdomen is soft. There is no mass.     Tenderness: There is no abdominal tenderness. There is no right CVA tenderness, left CVA tenderness, guarding or rebound.  Musculoskeletal:        General: No swelling or tenderness. Normal range of motion.     Cervical back: Normal range of motion. No rigidity or tenderness.     Right lower leg: No edema.     Left lower leg: No edema.  Lymphadenopathy:     Cervical: No cervical adenopathy.  Skin:    General: Skin is warm and dry.     Coloration: Skin is not pale.     Findings: No bruising, erythema, lesion or rash.  Neurological:     Mental Status: She is alert and oriented to person, place, and time.     Cranial Nerves: No cranial nerve deficit.     Sensory: No sensory deficit.     Motor: No weakness.      Coordination: Coordination normal.     Gait: Gait normal.  Psychiatric:        Mood and Affect: Mood normal.        Speech: Speech normal.        Behavior: Behavior normal.        Thought Content: Thought content normal.        Judgment: Judgment normal.    Labs reviewed: Recent Labs    06/23/22 1030 09/05/22 0932 12/20/22 0914  NA 139 139 139  K 4.3 4.3 4.4  CL 106 104 106  CO2 25 28 24   GLUCOSE 102* 101* 101*  BUN 10 8 10   CREATININE 0.80 0.68 0.69  CALCIUM 8.8 9.1 8.9   Recent Labs    01/21/22 2324 02/24/22 1007 06/23/22 1030 09/05/22 0932 12/20/22 0914  AST 17   < > 13 12 15   ALT 16   < > 11 12 13   ALKPHOS 58  --   --   --   --   BILITOT 0.3   < > 0.3 0.2 0.3  PROT 6.6   < > 7.0 6.7 6.8  ALBUMIN 3.5  --   --   --   --    < > = values in this interval not displayed.   Recent Labs    06/23/22 1030 09/05/22 0932 12/20/22 0914  WBC 4.7 5.8 4.8  NEUTROABS 2,167 2,691 2,141  HGB 8.4* 11.4* 12.7  HCT 30.9* 38.9 39.7  MCV 65.7* 76.6* 81.5  PLT 440* 421* 313     Lab Results  Component Value Date   CHOL 203 (H) 12/20/2022   HDL 52 12/20/2022   LDLCALC 130 (H) 12/20/2022   TRIG 100 12/20/2022   CHOLHDL 3.9 12/20/2022    Significant Diagnostic Results in last 30 days:  No results found.  Assessment/Plan 1. Primary hypertension Blood pressure well-controlled Continue on losartan - CBC with Differential/Platelet;  Future - COMPLETE METABOLIC PANEL WITH GFR; Future  2. Acquired hypothyroidism Lab Results  Component Value Date   TSH 4.11 12/20/2022  Continue on levothyroxine - TSH; Future  3. Mixed hyperlipidemia LDL not at goal 130 Continue on simvastatin Dietary modification and exercise advised - Lipid panel; Future  4. Iron deficiency anemia, unspecified iron deficiency anemia type Continue on ferrous sulfate  - encouraged to increase foods that are high in iron such as dark leafy veggies,beets etc  - CBC with Differential/Platelet;  Future  5. DM type 2 with diabetic mixed hyperlipidemia (HCC) Lab Results  Component Value Date   HGBA1C 6.5 (H) 12/20/2022  -Dietary modification and exercise advised  - Microalbumin / creatinine urine ratio - Amb Ref to Medical Weight Management - Hemoglobin A1c; Future  6. BMI 50.0-59.9, adult (HCC) BMI 55.78 Dietary modification and exercise advised Will refer to weight management - Amb Ref to Medical Weight Management  Family/ staff Communication: Reviewed plan of care with patient verbalized understanding  Labs/tests ordered:  - CBC with Differential/Platelet - CMP with eGFR([facility]) - TSH - Hgb A1C - Lipid panel  Next Appointment : Return in about 6 months (around 06/25/2023) for medical mangement of chronic issues. Pap smear in one week , Fasting labs in 6 months prior to visit.   Caesar Bookman, NP

## 2022-12-26 LAB — MICROALBUMIN / CREATININE URINE RATIO
Creatinine, Urine: 177 mg/dL (ref 20–275)
Microalb, Ur: <0.2 mg/dL

## 2023-01-02 ENCOUNTER — Encounter: Payer: PRIVATE HEALTH INSURANCE | Admitting: Family

## 2023-01-09 ENCOUNTER — Other Ambulatory Visit: Payer: Self-pay

## 2023-01-09 ENCOUNTER — Encounter: Payer: Self-pay | Admitting: Family

## 2023-01-09 ENCOUNTER — Encounter: Payer: PRIVATE HEALTH INSURANCE | Admitting: Family

## 2023-01-09 DIAGNOSIS — E782 Mixed hyperlipidemia: Secondary | ICD-10-CM

## 2023-01-09 MED ORDER — SIMVASTATIN 20 MG PO TABS: 20.0000 mg | ORAL_TABLET | Freq: Every day | ORAL | 1 refills | Status: DC

## 2023-01-09 NOTE — Progress Notes (Signed)
  This encounter was created in error - please disregard. No show 

## 2023-01-12 ENCOUNTER — Encounter: Payer: Self-pay | Admitting: Family

## 2023-01-12 ENCOUNTER — Ambulatory Visit (INDEPENDENT_AMBULATORY_CARE_PROVIDER_SITE_OTHER): Payer: PRIVATE HEALTH INSURANCE | Admitting: Family

## 2023-01-12 VITALS — BP 130/80 | HR 78 | Temp 97.3°F | Ht 66.0 in | Wt 338.2 lb

## 2023-01-12 DIAGNOSIS — E1169 Type 2 diabetes mellitus with other specified complication: Secondary | ICD-10-CM | POA: Diagnosis not present

## 2023-01-12 DIAGNOSIS — E782 Mixed hyperlipidemia: Secondary | ICD-10-CM | POA: Diagnosis not present

## 2023-01-12 DIAGNOSIS — Z124 Encounter for screening for malignant neoplasm of cervix: Secondary | ICD-10-CM | POA: Diagnosis not present

## 2023-01-12 LAB — HM PAP SMEAR: HM Pap smear: NORMAL

## 2023-01-12 NOTE — Progress Notes (Unsigned)
Provider: Marlowe Sax FNP-C  [place] Morren, Nelda Bucks, NP  Patient Care Team: Richardson Dubree, Nelda Bucks, NP as PCP - General (Family Medicine) Allyn Kenner, DO as Consulting Physician (Obstetrics and Gynecology)  Extended Emergency Contact Information Primary Emergency Contact: Baker Pierini Address: [address]          [place], [place] [address] [place] Mobile Phone: [phone] Relation: Spouse  Code Status:  Full Code  Goals of care: Advanced Directive information    12/25/2022   10:01 AM  Advanced Directives  Does Patient Have a Medical Advance Directive? No  Would patient like information on creating a medical advance directive? No - Patient declined     Chief Complaint  Patient presents with   Medical Management of Chronic Issues    Pap smear and discuss lab results. Patient has concerns about Metformin. Patient states that the highest glucose has been is 110 fasting. At night it is like 107    HPI:  Pt is a 37 y.o. female seen today for an acute visit for pap smear.she denies any vaginal bleeding,discharge,fever or chills.  Recent lab results discussed and reviewed during visit.Hgb A1C 6.5 ,Total chol 203 and LDL 130.States has changed her diet by reducing the carbohydrates portions and cut out on the sweets.Has also started walking exercises.Has lost 8 lbs since making dietary changes and exercise. Previous weight one month ago was 346 lbs now down to 338 lbs today.Patient congratulated for making lifestyle changes.Encouraged to continue. Metformin was recommended for diabets but would like to continue with the dietary modification and exercise for now then recheck A1C. She is determined to bring it down.    Past Medical History:  Diagnosis Date   Alcohol use disorder in remission    Anemia    Former cigarette smoker    Hypertension    Pregnancy induced hypertension    Sleep apnea    Past Surgical History:  Procedure Laterality Date   CESAREAN  SECTION     CESAREAN SECTION WITH BILATERAL TUBAL LIGATION  03/02/2014   Procedure: Repeat CESAREAN SECTION WITH BILATERAL TUBAL LIGATION;  Surgeon: Lahoma Crocker, MD;  Location: [facility];  Service: Obstetrics;;   DILATION AND CURETTAGE OF UTERUS     WISDOM TOOTH EXTRACTION      No Known Allergies  Outpatient Encounter Medications as of 01/12/2023  Medication Sig   levothyroxine (SYNTHROID) 75 MCG tablet Take 1 tablet (75 mcg total) by mouth daily. Take on an empty stomach 2 hours away from other medications   losartan (COZAAR) 50 MG tablet TAKE 1 TABLET BY MOUTH EVERY DAY   simvastatin (ZOCOR) 20 MG tablet Take 1 tablet (20 mg total) by mouth at bedtime. E78.2   ferrous sulfate 325 (65 FE) MG tablet TAKE 1 TABLET BY MOUTH EVERY DAY WITH BREAKFAST (Patient not taking: Reported on 01/12/2023)   [DISCONTINUED] amLODipine (NORVASC) 5 MG tablet Take 1 tablet (5 mg total) by mouth daily. (Patient not taking: Reported on 04/07/2021)   [DISCONTINUED] valsartan (DIOVAN) 160 MG tablet Take 160 mg by mouth daily. (Patient not taking: Reported on 04/07/2021)   No facility-administered encounter medications on file as of 01/12/2023.    Review of Systems  Constitutional:  Negative for appetite change, chills, fatigue, fever and unexpected weight change.  Eyes:  Negative for pain, discharge, redness, itching and visual disturbance.  Respiratory:  Negative for cough, chest tightness, shortness of breath and wheezing.   Cardiovascular:  Negative for chest pain, palpitations and leg swelling.  Gastrointestinal:  Negative for abdominal distention, abdominal pain, blood in stool, constipation, diarrhea, nausea and vomiting.  Genitourinary:  Negative for difficulty urinating, dysuria, flank pain, frequency, hematuria, pelvic pain, urgency, vaginal bleeding, vaginal discharge and vaginal pain.  Musculoskeletal:  Negative for arthralgias, back pain, gait problem, joint swelling, myalgias, neck pain and neck  stiffness.  Skin:  Negative for color change, pallor, rash and wound.    Immunization History  Administered Date(s) Administered   Influenza Inj Mdck Quad Pf 08/20/2018   Influenza-Unspecified 08/27/2022   PFIZER(Purple Top)SARS-COV-2 Vaccination 02/05/2020, 02/26/2020   Td (Adult),5 Lf Tetanus Toxid, Preservative Free 09/28/2022   Tdap 12/09/2020   Pertinent  Health Maintenance Due  Topic Date Due   PAP SMEAR-Modifier  08/07/2016   INFLUENZA VACCINE  Completed      06/23/2022    9:59 AM 07/11/2022    3:13 PM 11/22/2022   12:30 PM 12/25/2022   10:01 AM 01/12/2023   10:08 AM  Fall Risk  Falls in the past year? 0 0  0 0  Was there an injury with Fall? 0 0  0 0  Fall Risk Category Calculator 0 0  0 0  Fall Risk Category (Retired) Low Low     (RETIRED) Patient Fall Risk Level Low fall risk Low fall risk Low fall risk    Patient at Risk for Falls Due to No Fall Risks No Fall Risks  History of fall(s) No Fall Risks  Fall risk Follow up Falls evaluation completed Falls evaluation completed  Falls evaluation completed Falls evaluation completed   Functional Status Survey:    Vitals:   01/12/23 1011  BP: 130/80  Pulse: 78  Temp: (!) 97.3 F (36.3 C)  SpO2: 97%  Weight: (!) 338 lb 3.2 oz (153.4 kg)  Height: 5' 6"$  (1.676 m)   Body mass index is 54.59 kg/m. Physical Exam Vitals reviewed. Exam conducted with a chaperone present [facility] Dillard,CMA).  Constitutional:      General: She is not in acute distress.    Appearance: Normal appearance. She is normal weight. She is not ill-appearing or diaphoretic.  HENT:     Head: Normocephalic.  Eyes:     General: No scleral icterus.       Right eye: No discharge.        Left eye: No discharge.     Conjunctiva/sclera: Conjunctivae normal.     Pupils: Pupils are equal, round, and reactive to light.  Neck:     Vascular: No carotid bruit.  Cardiovascular:     Rate and Rhythm: Normal rate and regular rhythm.     Pulses: Normal  pulses.     Heart sounds: Normal heart sounds. No murmur heard.    No friction rub. No gallop.  Pulmonary:     Effort: Pulmonary effort is normal. No respiratory distress.     Breath sounds: Normal breath sounds. No wheezing, rhonchi or rales.  Chest:     Chest wall: No tenderness.  Abdominal:     General: Bowel sounds are normal. There is no distension.     Palpations: Abdomen is soft. There is no mass.     Tenderness: There is no abdominal tenderness. There is no right CVA tenderness, left CVA tenderness, guarding or rebound.     Hernia: There is no hernia in the left inguinal area or right inguinal area.  Genitourinary:    Exam position: Lithotomy position.     Labia:        Right: No  rash, tenderness, lesion or injury.        Left: No rash, tenderness, lesion or injury.      Urethra: No prolapse, urethral pain, urethral swelling or urethral lesion.     Vagina: Normal.     Cervix: Normal.     Uterus: Normal.      Adnexa: Right adnexa normal and left adnexa normal.  Musculoskeletal:        General: No swelling or tenderness. Normal range of motion.     Cervical back: Normal range of motion. No rigidity or tenderness.     Right lower leg: No edema.     Left lower leg: No edema.  Lymphadenopathy:     Cervical: No cervical adenopathy.     Lower Body: No right inguinal adenopathy. No left inguinal adenopathy.  Skin:    General: Skin is warm and dry.     Coloration: Skin is not pale.     Findings: No bruising, erythema, lesion or rash.  Neurological:     Mental Status: She is alert and oriented to person, place, and time.     Cranial Nerves: No cranial nerve deficit.     Sensory: No sensory deficit.     Motor: No weakness.     Coordination: Coordination normal.     Gait: Gait normal.  Psychiatric:        Mood and Affect: Mood normal.        Speech: Speech normal.        Behavior: Behavior normal.     Labs reviewed: Recent Labs    06/23/22 1030 09/05/22 0932  12/20/22 0914  NA 139 139 139  K 4.3 4.3 4.4  CL 106 104 106  CO2 25 28 24  $ GLUCOSE 102* 101* 101*  BUN 10 8 10  $ CREATININE 0.80 0.68 0.69  CALCIUM 8.8 9.1 8.9   Recent Labs    01/21/22 2324 02/24/22 1007 06/23/22 1030 09/05/22 0932 12/20/22 0914  AST 17   < > 13 12 15  $ ALT 16   < > 11 12 13  $ ALKPHOS 58  --   --   --   --   BILITOT 0.3   < > 0.3 0.2 0.3  PROT 6.6   < > 7.0 6.7 6.8  ALBUMIN 3.5  --   --   --   --    < > = values in this interval not displayed.   Recent Labs    06/23/22 1030 09/05/22 0932 12/20/22 0914  WBC 4.7 5.8 4.8  NEUTROABS 2,167 2,691 2,141  HGB 8.4* 11.4* 12.7  HCT 30.9* 38.9 39.7  MCV 65.7* 76.6* 81.5  PLT 440* 421* 313   Lab Results  Component Value Date   TSH 4.11 12/20/2022   Lab Results  Component Value Date   HGBA1C 6.5 (H) 12/20/2022   Lab Results  Component Value Date   CHOL 203 (H) 12/20/2022   HDL 52 12/20/2022   LDLCALC 130 (H) 12/20/2022   TRIG 100 12/20/2022   CHOLHDL 3.9 12/20/2022    Significant Diagnostic Results in last 30 days:  No results found.  Assessment/Plan  1. Encounter for Papanicolaou smear for cervical cancer screening Normal pelvic exam.tolerated procedure well. Chaperone present through out the exam - PAP, Image Guided [[facility]]  2. DM type 2 with diabetic mixed hyperlipidemia ([place]) Lab Results  Component Value Date   HGBA1C 6.5 (H) 12/20/2022  Metformin recommended but has made dietary modification and started exercising.Has had 8 lbs  weight loss  - advised to continue with dietary modification and exercise at least three times per week for 30 minutes.  - will recheck A1C in 4 months if still high then start on metformin.   Family/ staff Communication: Reviewed plan of care with patient verbalized understanding   Labs/tests ordered:  - PAP, Image Guided [[facility]]  Next Appointment: Return if symptoms worsen or fail to improve.   Sandrea Hughs, NP

## 2023-01-12 NOTE — Patient Instructions (Signed)

## 2023-01-16 LAB — PAP IG (IMAGE GUIDED)

## 2023-01-16 NOTE — Progress Notes (Signed)
Pap was negative for lesion or malignancy.

## 2023-01-29 ENCOUNTER — Telehealth: Payer: Self-pay | Admitting: Pulmonary Disease

## 2023-01-29 NOTE — Telephone Encounter (Signed)
Just an FYI When you have a moment or lighter schedule can you please fill this out.  Adding Horris Latino since she is with you this week

## 2023-01-29 NOTE — Telephone Encounter (Signed)
PT presents to the front desk to have a DOT for to be completed bby Dr. Jenetta Downer. Wanted to wait. I will ask Triage and put it in his box if Triage can not accommodate PT.

## 2023-01-30 NOTE — Telephone Encounter (Signed)
addresssed

## 2023-01-31 NOTE — Telephone Encounter (Signed)
Pt in [place] about forms.[name]

## 2023-02-01 ENCOUNTER — Telehealth: Payer: Self-pay

## 2023-02-01 NOTE — Telephone Encounter (Signed)
Patient came into the office to pick up her her forms for her DOT physical that patient did drop off on Monday for Dr.Olalere to finish filling out.Per Dr.Olalere patient has not followed up on her CPAP and has not been compliance.Patient did come in today after she got her forms and had more questions regarding the question regarding her CPAP.Patient did have a APP on her phone for her CPAP.Patient was able to email the info to [place] and we was able to print out the download. Spoke with Games developer and patient was only 54% usage with CPAP and patient needs to be 70% usage or more I have explained this with the patient and patient was upset because she would need to be out of work for 4-6 and make a f/u with a provider . Advised patient we need to make a f/u 4-6 weeks patient did make a f/u with Dr.Olalere for 4-6 weeks for compliance on CPAP. Faxed over forms back to [facility] employer health and CPAP down load. Patient is aware and does have a f/u 02/26/2023. Patient's voice was understanding.Nothing else further needed.

## 2023-02-21 ENCOUNTER — Encounter (INDEPENDENT_AMBULATORY_CARE_PROVIDER_SITE_OTHER): Payer: PRIVATE HEALTH INSURANCE | Admitting: Family Medicine

## 2023-02-26 ENCOUNTER — Ambulatory Visit (INDEPENDENT_AMBULATORY_CARE_PROVIDER_SITE_OTHER): Payer: PRIVATE HEALTH INSURANCE | Admitting: Pulmonary Disease

## 2023-02-26 ENCOUNTER — Encounter: Payer: Self-pay | Admitting: Pulmonary Disease

## 2023-02-26 VITALS — BP 136/80 | HR 70 | Ht 66.0 in | Wt 333.2 lb

## 2023-02-26 DIAGNOSIS — G4733 Obstructive sleep apnea (adult) (pediatric): Secondary | ICD-10-CM | POA: Diagnosis not present

## 2023-02-26 NOTE — Progress Notes (Signed)
[name]    NO:[id number]    [birth year]  Primary Care Physician:Ngetich, Nelda Bucks, NP  Referring Physician: Sandrea Hughs, NP [address] [place],  [place] [address]  Chief complaint:    Patient being followed for severe obstructive sleep apnea  HPI:  Patient diagnosed with obstructive sleep apnea about 2 years ago, was not using CPAP As recently started using CPAP again Feeling better Sleeping better Waking up feeling like she is at a good nights rest She is a Games developer and needed DOT recertification  Most recent sleep study shows severe obstructive sleep apnea AHI was 99.5 an hour, optimally titrated to CPAP of 12  Has been using CPAP DME company -[employer]   History is significant for Admits to snoring Usually goes to bed between 10 and 11, 10 to 15 minutes to fall asleep About 2 awakenings Final wake up time about 5:45 AM  Weight is up recently about 10 pounds  She has a history of hypertension which is well controlled Dad does snore  Denies any significant dryness of the mouth in the morning No morning headaches Memory is good Reformed smoker quit in 2022  She is a schoolbus driver  Outpatient Encounter Medications as of 02/26/2023  Medication Sig   levothyroxine (SYNTHROID) 75 MCG tablet Take 1 tablet (75 mcg total) by mouth daily. Take on an empty stomach 2 hours away from other medications   losartan (COZAAR) 50 MG tablet TAKE 1 TABLET BY MOUTH EVERY DAY   simvastatin (ZOCOR) 20 MG tablet Take 1 tablet (20 mg total) by mouth at bedtime. E78.2   [DISCONTINUED] amLODipine (NORVASC) 5 MG tablet Take 1 tablet (5 mg total) by mouth daily. (Patient not taking: Reported on 04/07/2021)   [DISCONTINUED] ferrous sulfate 325 (65 FE) MG tablet TAKE 1 TABLET BY MOUTH EVERY DAY WITH BREAKFAST (Patient not taking: Reported on 02/26/2023)   [DISCONTINUED] valsartan (DIOVAN) 160 MG tablet Take 160 mg by mouth daily. (Patient not taking: Reported on  04/07/2021)   No facility-administered encounter medications on file as of 02/26/2023.    Allergies as of 02/26/2023   (No Known Allergies)    Past Medical History:  Diagnosis Date   Alcohol use disorder in remission    Anemia    Former cigarette smoker    Hypertension    Pregnancy induced hypertension    Sleep apnea     Past Surgical History:  Procedure Laterality Date   CESAREAN SECTION     CESAREAN SECTION WITH BILATERAL TUBAL LIGATION  03/02/2014   Procedure: Repeat CESAREAN SECTION WITH BILATERAL TUBAL LIGATION;  Surgeon: Lahoma Crocker, MD;  Location: [facility];  Service: Obstetrics;;   DILATION AND CURETTAGE OF UTERUS     WISDOM TOOTH EXTRACTION      Family History  Problem Relation Age of Onset   Diabetes Mother    Congestive Heart Failure Mother    Hypertension Father     Social History   Socioeconomic History   Marital status: Single    Spouse name: Not on file   Number of children: Not on file   Years of education: Not on file   Highest education level: Not on file  Occupational History   Not on file  Tobacco Use   Smoking status: Former    Packs/day: .5    Types: Cigarettes   Smokeless tobacco: Never  Vaping Use   Vaping Use: Never used  Substance and Sexual Activity  Alcohol use: Yes    Comment: occas   Drug use: Never   Sexual activity: Not Currently    Birth control/protection: Abstinence, None, Surgical  Other Topics Concern   Not on file  Social History Narrative   Tobacco use, amount per day now: Quit in January 2023   Past tobacco use, amount per day: 6 to 7 daily.   How many years did you use tobacco: over 20 years.   Alcohol use (drinks per week): None   Diet:   Do you drink/eat things with caffeine: Occasionally.   Marital status:  Married                                What year were you married? 2015   Do you live in a house, apartment, assisted living, condo, trailer, etc.? House   Is it one or more stories? Two story.   How  many persons live in your home? 5   Do you have pets in your home?( please list) No.   Highest Level of education completed? High School   Current or past profession: Recruitment consultant   Do you exercise?  Yes                               Type and how often? Once a week.   Do you have a living will? No   Do you have a DNR form?   No                                If not, do you want to discuss one? Yes   Do you have signed POA/HPOA forms?  No                      If so, please bring to you appointment      Do you have any difficulty bathing or dressing yourself? No   Do you have any difficulty preparing food or eating? No   Do you have any difficulty managing your medications? No   Do you have any difficulty managing your finances? No   Do you have any difficulty affording your medications? No   Social Determinants of Radio broadcast assistant Strain: Not on file  Food Insecurity: Not on file  Transportation Needs: Not on file  Physical Activity: Not on file  Stress: Not on file  Social Connections: Not on file  Intimate Partner Violence: Not on file    Review of Systems  Constitutional:  Negative for fatigue.  Respiratory:  Positive for apnea.   Psychiatric/Behavioral:  Positive for sleep disturbance.     Vitals:   02/26/23 0851  BP: 136/80  Pulse: 70  SpO2: 100%     Physical Exam Constitutional:      Appearance: She is obese.  HENT:     Head: Normocephalic.     Mouth/Throat:     Mouth: Mucous membranes are moist.     Comments: Mallampati 2, crowded oropharynx, relative macroglossia Eyes:     General: No scleral icterus.    Pupils: Pupils are equal, round, and reactive to light.  Cardiovascular:     Rate and Rhythm: Normal rate and regular rhythm.     Heart sounds: No murmur heard.    No friction rub.  Pulmonary:     Effort: No respiratory distress.     Breath sounds: No stridor. No wheezing or rhonchi.  Musculoskeletal:     Cervical back: No rigidity or  tenderness.  Neurological:     Mental Status: She is alert.  Psychiatric:        Mood and Affect: Mood normal.       08/02/2022    1:31 PM  Results of the Epworth flowsheet  Sitting and reading 0  Watching TV 1  Sitting, inactive in a public place (e.g. a theatre or a meeting) 0  As a passenger in a car for an hour without a break 0  Lying down to rest in the afternoon when circumstances permit 3  Sitting and talking to someone 0  Sitting quietly after a lunch without alcohol 0  In a car, while stopped for a few minutes in traffic 0  Total score 4   Data Reviewed: Sleep study was reviewed with AHI noted to be 99.5, titrated CPAP of 12  Compliance data over the last 30 days shows 73% compliance over hours of use nightly, AHI of 0.34 events an hour  Assessment:  History of severe obstructive sleep apnea -Recently reevaluated on sleep study showing optimal treatment at CPAP of 12 -Has been using CPAP 12 on a nightly basis -Improvement in daytime symptoms -Quality of sleep also felt to be better  Class III obesity -Working on weight loss  Pathophysiology and treatment options for sleep disordered breathing discussed Risks with having sleep apnea that is untreated discussed with the patient  Plan/Recommendations: Encouraged to continue using CPAP on a nightly basis  Encouraged to get 6 to 7 hours of sleep nightly  She is committed to continuing to use CPAP Residual AHI of 0.34 events an hour over the last 30 days of use  Obstructive sleep apnea is adequately treated  Follow-up will be scheduled in 3 months  Encouraged to call with significant concerns    Sherrilyn Rist MD [facility] 02/26/2023, 8:57 AM  CC: Ngetich, Nelda Bucks, NP

## 2023-02-26 NOTE — Patient Instructions (Signed)
The download from your machine shows that your CPAP is working very well with number of events noted at 0.42 events an hour  Continue using CPAP on a nightly basis  Make sure you are getting at least 6 to 7 hours of sleep nightly  Follow-up in 3 months  From a treatment perspective, your sleep apnea is optimally treated as long as you continue to use your CPAP on a regular basis

## 2023-04-21 ENCOUNTER — Other Ambulatory Visit: Payer: Self-pay | Admitting: Family

## 2023-05-22 ENCOUNTER — Other Ambulatory Visit: Payer: Self-pay

## 2023-05-22 DIAGNOSIS — E782 Mixed hyperlipidemia: Secondary | ICD-10-CM

## 2023-05-22 DIAGNOSIS — D509 Iron deficiency anemia, unspecified: Secondary | ICD-10-CM

## 2023-05-22 DIAGNOSIS — E039 Hypothyroidism, unspecified: Secondary | ICD-10-CM

## 2023-05-22 DIAGNOSIS — E1169 Type 2 diabetes mellitus with other specified complication: Secondary | ICD-10-CM

## 2023-05-22 DIAGNOSIS — I10 Essential (primary) hypertension: Secondary | ICD-10-CM

## 2023-06-21 ENCOUNTER — Other Ambulatory Visit: Payer: PRIVATE HEALTH INSURANCE

## 2023-06-25 ENCOUNTER — Encounter: Payer: Self-pay | Admitting: Family

## 2023-06-25 ENCOUNTER — Encounter: Payer: PRIVATE HEALTH INSURANCE | Admitting: Family

## 2023-06-25 NOTE — Progress Notes (Signed)
  This encounter was created in error - please disregard. No show 

## 2023-07-31 ENCOUNTER — Ambulatory Visit: Payer: PRIVATE HEALTH INSURANCE | Admitting: Pulmonary Disease

## 2023-08-24 ENCOUNTER — Ambulatory Visit: Payer: PRIVATE HEALTH INSURANCE | Admitting: Pulmonary Disease

## 2023-08-31 ENCOUNTER — Encounter: Payer: Self-pay | Admitting: Pulmonary Disease

## 2023-11-23 ENCOUNTER — Other Ambulatory Visit: Payer: Self-pay | Admitting: Family

## 2023-12-30 ENCOUNTER — Other Ambulatory Visit: Payer: Self-pay | Admitting: Family

## 2023-12-30 DIAGNOSIS — I10 Essential (primary) hypertension: Secondary | ICD-10-CM

## 2024-05-23 LAB — AMB RESULTS CONSOLE CBG: Glucose: 99

## 2024-05-23 LAB — HEMOGLOBIN A1C: Hemoglobin A1C: 6.2

## 2024-05-23 NOTE — Progress Notes (Signed)
 Patient came to mobile screening. Stated she currently takes BP medication but has  not done so yet for today. She is experiecing a slight headache that started to occur about a hour before. BP was elevated 164/106<162/114. It was referred that she make an appointment with PCP provider to discuss BP meds and its effectiveness. If not able to get PCP appointment mobile clinic schedule was given.  A1C 6.2% non fasting glucose 99. We discussed living an active healthy lifestyle. Patient indicated NO SDOH needs at this time.

## 2024-05-28 ENCOUNTER — Other Ambulatory Visit: Payer: Self-pay | Admitting: Family

## 2024-06-06 NOTE — Progress Notes (Deleted)
 Location:   Clinic [facility]   Place of Service:    Provider: Larwance Hark NP  Code Status: DNR Goals of Care:     06/25/2023    9:04 AM  Advanced Directives  Does Patient Have a Medical Advance Directive? No  Would patient like information on creating a medical advance directive? No - Patient declined     No chief complaint on file.   HPI: Patient is a 38 y.o. female seen today for an acute visit for Discussed the use of AI scribe software for clinical note transcription with the patient, who gave verbal consent to proceed.  History of Present Illness  Discussed the use of AI scribe software for clinical note transcription with the patient, who gave verbal consent to proceed.   Hypothyroidism, on Levothyroxine , TSH 4.11 12/20/22  HTN, taking Losartan   HLD on Simvastatin   Prediabetes Hgb A1c 6.2 05/23/24   Past Medical History:  Diagnosis Date   Alcohol use disorder in remission    Anemia    Former cigarette smoker    Hypertension    Pregnancy induced hypertension    Sleep apnea     Past Surgical History:  Procedure Laterality Date   CESAREAN SECTION     CESAREAN SECTION WITH BILATERAL TUBAL LIGATION  03/02/2014   Procedure: Repeat CESAREAN SECTION WITH BILATERAL TUBAL LIGATION;  Surgeon: Olam Mill, MD;  Location: [facility];  Service: Obstetrics;;   DILATION AND CURETTAGE OF UTERUS     WISDOM TOOTH EXTRACTION      No Known Allergies  Allergies as of 06/09/2024   No Known Allergies      Medication List        Accurate as of June 06, 2024 12:55 PM. If you have any questions, ask your nurse or doctor.          levothyroxine  75 MCG tablet Commonly known as: SYNTHROID  TAKE 1 TABLET BY MOUTH DAILY. TAKE ON AN EMPTY STOMACH 2 HOURS AWAY FROM OTHER MEDICATIONS   losartan  50 MG tablet Commonly known as: COZAAR  TAKE 1 TABLET BY MOUTH EVERY DAY   simvastatin  20 MG tablet Commonly known as: ZOCOR  Take 1 tablet (20 mg total) by mouth at bedtime. E78.2         Review of Systems:  Review of Systems  Health Maintenance  Topic Date Due   Pneumococcal Vaccine 19-37 Years old (1 of 2 - PCV) Never done   Hepatitis B Vaccines (1 of 3 - 19+ 3-dose series) Never done   HPV VACCINES (1 - 3-dose SCDM series) Never done   COVID-19 Vaccine (3 - 2024-25 season) 07/29/2023   Diabetic kidney evaluation - eGFR measurement  12/21/2023   Diabetic kidney evaluation - Urine ACR  12/26/2023   INFLUENZA VACCINE  06/27/2024   Cervical Cancer Screening (HPV/Pap Cotest)  01/12/2026   DTaP/Tdap/Td (3 - Td or Tdap) 09/28/2032   Hepatitis C Screening  Completed   HIV Screening  Completed   Meningococcal B Vaccine  Aged Out    Physical Exam: There were no vitals filed for this visit. There is no height or weight on file to calculate BMI. Physical Exam  Labs reviewed: Basic Metabolic Panel: No results for input(s): NA, K, CL, CO2, GLUCOSE, BUN, CREATININE, CALCIUM , MG, PHOS, TSH in the last 8760 hours. Liver Function Tests: No results for input(s): AST, ALT, ALKPHOS, BILITOT, PROT, ALBUMIN in the last 8760 hours. No results for input(s): LIPASE, AMYLASE in the last 8760 hours. No results for input(s):  AMMONIA in the last 8760 hours. CBC: No results for input(s): WBC, NEUTROABS, HGB, HCT, MCV, PLT in the last 8760 hours. Lipid Panel: No results for input(s): CHOL, HDL, LDLCALC, TRIG, CHOLHDL, LDLDIRECT in the last 8760 hours. Lab Results  Component Value Date   HGBA1C 6.2 05/23/2024    Procedures since last visit: No results found.  Assessment/Plan Assessment & Plan       Labs/tests ordered:  * No order type specified * Next appt:  Visit date not found

## 2024-06-09 ENCOUNTER — Ambulatory Visit: Payer: PRIVATE HEALTH INSURANCE | Admitting: Family

## 2024-06-25 ENCOUNTER — Other Ambulatory Visit: Payer: Self-pay | Admitting: Family

## 2024-06-25 DIAGNOSIS — I10 Essential (primary) hypertension: Secondary | ICD-10-CM

## 2024-06-25 NOTE — Telephone Encounter (Addendum)
 Pharmacy requesting refill.  Last Appt: 01/12/2023 No Upcoming appointment  Note added to Rx Needs follow up appointment and MyChart message sent to patient.   Pended Rx and sent to [facility] for approval due to HIGH ALERT warning.

## 2024-06-28 ENCOUNTER — Other Ambulatory Visit: Payer: Self-pay | Admitting: Family

## 2024-06-30 NOTE — Telephone Encounter (Signed)
 I left a detailed message informing patient she is overdue for an appointment and we can see her this week for a routine follow-up

## 2024-08-26 NOTE — Progress Notes (Addendum)
 The patient attended a screening event on 05/23/2024, where her blood pressure was measured at 162/114 mmHg after a second check, and her A1C was 6.2%, placing her in the prediabetic range, and her non-fasting glucose was 99 mg/dl. During the event, the patient reported that she does smoke, has Private/VA insurance, she did not indicate a PCP, and she declined the SDOH screener.  A chart review indicates Roxan Plough, NP - [facility] as her PCP. The patient does not have any appts with her PCP within the last 12 months. Her last appt was on 01/12/2023 and she has a missed appt with anther provider in the same office as her PCP on 06/09/2024. The pt does not have any [facility] visible upcoming appts with her PCP at this time. Chart review further shows bp medication refill encounters from her PCP within the last 12 months. Chart review further indicates [employer] as her insurance and no SDOH needs indicated at this time.   Call Attempt #1: CHW called pt to discuss PCP status and appt status. CHW asked pt I she is currently being seen by her PCP. Pt shared she has not been seen by her PCP lately due to insurance issues and ability to cover bp medication. CHW asked pt if she wishes to obtain insurance resources. Pt agreed to receive letter with insurance resources. CHW encouraged pt to schedule appt with her PCP and to review bp education that will be included in letter in case needed. Pt agreed to review information and will proceed with next steps afterwards.   CHW sent letter with Medicaid, [employer], and [place] financial flyers, bp education, smoking cessation, and Healthy Cooking classes in cases needed by the pt.   An additional follow up will be done at a later date per the Health Equity Teams protocol.

## 2024-09-25 ENCOUNTER — Ambulatory Visit (INDEPENDENT_AMBULATORY_CARE_PROVIDER_SITE_OTHER): Payer: PRIVATE HEALTH INSURANCE | Admitting: Family

## 2024-09-25 ENCOUNTER — Encounter: Payer: Self-pay | Admitting: Family

## 2024-09-25 VITALS — BP 164/108 | HR 95 | Temp 97.8°F | Resp 20 | Ht 66.0 in | Wt 335.0 lb

## 2024-09-25 DIAGNOSIS — E039 Hypothyroidism, unspecified: Secondary | ICD-10-CM

## 2024-09-25 DIAGNOSIS — E782 Mixed hyperlipidemia: Secondary | ICD-10-CM

## 2024-09-25 DIAGNOSIS — Z1231 Encounter for screening mammogram for malignant neoplasm of breast: Secondary | ICD-10-CM

## 2024-09-25 DIAGNOSIS — I10 Essential (primary) hypertension: Secondary | ICD-10-CM

## 2024-09-25 DIAGNOSIS — E119 Type 2 diabetes mellitus without complications: Secondary | ICD-10-CM

## 2024-09-25 MED ORDER — CLONIDINE HCL 0.1 MG PO TABS
0.1000 mg | ORAL_TABLET | Freq: Once | ORAL | Status: AC
  Administered 2024-09-25: 0.1 mg via ORAL

## 2024-09-25 MED ORDER — LEVOTHYROXINE SODIUM 75 MCG PO TABS
75.0000 ug | ORAL_TABLET | Freq: Every day | ORAL | 1 refills | Status: AC
Start: 2024-09-25 — End: ?

## 2024-09-25 MED ORDER — SIMVASTATIN 20 MG PO TABS: 20.0000 mg | ORAL_TABLET | Freq: Every day | ORAL | 1 refills | Status: AC

## 2024-09-25 MED ORDER — LOSARTAN POTASSIUM 50 MG PO TABS: 50.0000 mg | ORAL_TABLET | Freq: Every day | ORAL | 1 refills | Status: AC

## 2024-09-26 ENCOUNTER — Ambulatory Visit
Admission: RE | Admit: 2024-09-26 | Discharge: 2024-09-26 | Disposition: A | Discharge status: Home / Self Care | Payer: PRIVATE HEALTH INSURANCE | Source: Ambulatory Visit | Attending: Family | Admitting: Family

## 2024-10-02 NOTE — Progress Notes (Signed)
 The patient attended a screening event on 05/23/2024 where her screening results were a BP of 162/114 after second check, A1C was 6.2 and glucose was 99. At the event the patient noted she is a smoker, has Private/VA insurance, did not indicate a PCP, and declined screening questions.   Per chart review patient has Roxan Plough, NP as her PCP, has [employer] as insurance, and has a medium smoking SDOH need. Chart review also indicates the patient has had a recent appt with her PCP on 09/25/2024.  CHW was able to reach patient for follow up. Patient confirmed that she has found a PCP and had a visit last week on 09/25/2024. Patient stated she has another upcoming appt with her PCP on 10/09/2024. CHW asked patient about insurance and she stated she now has [employer] for insurance. Patient states she does not have any SDOH needs but mentioned needing resources in regards to assistance paying her mortgage. She stated she is not facing homelessness but would like some courtesy resources if available. Patient seems engaged in her care at this time. No additional Health equity team support indicated at this time.

## 2024-10-05 NOTE — Progress Notes (Signed)
 Provider: Roxan Plough FNP-C   [name], Roxan BROCKS, NP  Patient Care Team: Khalaya Mcgurn, Roxan BROCKS, NP as PCP - General (Family Medicine) Lilton Legions, DO as Consulting Physician (Obstetrics and Gynecology)  Extended Emergency Contact Information Primary Emergency Contact: Tutor,[name] Address: [address]          [place], [place] [address] [place] Phone: [phone] Relation: Spouse  Code Status: Full code Goals of care: Advanced Directive information    09/25/2024    2:32 PM  Advanced Directives  Does Patient Have a Medical Advance Directive? No  Would patient like information on creating a medical advance directive? No - Patient declined     Chief Complaint  Patient presents with   Headache    Headaches due to blood pressure.    Discussed the use of AI scribe software for clinical note transcription with the patient, who gave verbal consent to proceed.  History of Present Illness   [name] is a 38 year old female with hypertension who presents with concerns about her blood pressure management.  Her blood pressure has been fluctuating since she ran out of her medication a couple of weeks ago. It was previously stable on medication. Today, her blood pressure was recorded at 160/108 mmHg. She experienced a headache yesterday, which she managed with a BC powder. No dizziness or chest pain.  She has a history of hypertension and was previously on losartan  50 mg daily, which replaced amlodipine . She also takes simvastatin  20 mg daily at bedtime for cholesterol management and levothyroxine  75 mcg for thyroid issues, which she takes on an empty stomach in the morning. She ran out of her medications due to a lapse in insurance coverage but has since obtained new insurance.  Her last lab work was done about a year ago, showing an A1c of 6.5%, and a total cholesterol of 203 mg/dL, which had improved from a previous level of 261 mg/dL. Her LDL was 130  mg/dL, improved from 821 mg/dL, and triglycerides were 100 mg/dL.  She reports a weight of 335 pounds, a slight decrease from 338 pounds a year ago, and a BMI of 54.59. She attributes her weight loss to dietary changes, including reducing pork intake and avoiding juices and sodas. She mentions eating a lot of ice and using water packs.  She works five to six days a week, with eight-hour shifts, but does not engage in additional exercise outside of work. She is concerned about her knees popping and describes them as 'rubbing'.  Past Medical History:  Diagnosis Date   Alcohol use disorder in remission    Anemia    Former cigarette smoker    Hypertension    Pregnancy induced hypertension    Sleep apnea    Past Surgical History:  Procedure Laterality Date   CESAREAN SECTION     CESAREAN SECTION WITH BILATERAL TUBAL LIGATION  03/02/2014   Procedure: Repeat CESAREAN SECTION WITH BILATERAL TUBAL LIGATION;  Surgeon: Olam Mill, MD;  Location: [facility];  Service: Obstetrics;;   DILATION AND CURETTAGE OF UTERUS     WISDOM TOOTH EXTRACTION      No Known Allergies  Allergies as of 09/25/2024   No Known Allergies      Medication List        Accurate as of September 25, 2024 11:59 PM. If you have any questions, ask your nurse or doctor.          levothyroxine  75 MCG tablet Commonly  known as: SYNTHROID  Take 1 tablet (75 mcg total) by mouth daily before breakfast. TAKE ON AN EMPTY STOMACH 2 HOURS AWAY FROM OTHER MEDICATIONS What changed: See the new instructions. Changed by: Kristyn Obyrne C Kadija Cruzen   losartan  50 MG tablet Commonly known as: COZAAR  Take 1 tablet (50 mg total) by mouth daily. Needs an appointment before anymore future refills.   simvastatin  20 MG tablet Commonly known as: ZOCOR  Take 1 tablet (20 mg total) by mouth at bedtime. E78.2        Review of Systems  Constitutional:  Negative for appetite change, chills, fatigue, fever and unexpected weight change.  HENT:   Negative for congestion, dental problem, ear discharge, ear pain, facial swelling, hearing loss, nosebleeds, postnasal drip, rhinorrhea, sinus pressure, sinus pain, sneezing, sore throat, tinnitus and trouble swallowing.   Eyes:  Negative for pain, discharge, redness, itching and visual disturbance.  Respiratory:  Negative for cough, chest tightness, shortness of breath and wheezing.   Cardiovascular:  Negative for chest pain, palpitations and leg swelling.  Gastrointestinal:  Negative for abdominal distention, abdominal pain, blood in stool, constipation, diarrhea, nausea and vomiting.  Endocrine: Negative for cold intolerance, heat intolerance, polydipsia, polyphagia and polyuria.  Genitourinary:  Negative for difficulty urinating, dysuria, flank pain, frequency and urgency.  Musculoskeletal:  Positive for arthralgias. Negative for back pain, gait problem, joint swelling, myalgias, neck pain and neck stiffness.       Knee pain   Skin:  Negative for color change, pallor, rash and wound.  Neurological:  Negative for dizziness, syncope, speech difficulty, weakness, light-headedness, numbness and headaches.  Hematological:  Does not bruise/bleed easily.  Psychiatric/Behavioral:  Negative for agitation, behavioral problems, confusion, hallucinations, self-injury, sleep disturbance and suicidal ideas. The patient is not nervous/anxious.     Immunization History  Administered Date(s) Administered   Influenza Inj Mdck Quad Pf 08/20/2018   Influenza-Unspecified 08/27/2022   PFIZER(Purple Top)SARS-COV-2 Vaccination 02/05/2020, 02/26/2020   Td (Adult),5 Lf Tetanus Toxid, Preservative Free 09/28/2022   Tdap 12/09/2020   Pertinent  Health Maintenance Due  Topic Date Due   Influenza Vaccine  06/27/2024   Mammogram  09/26/2026      11/22/2022   12:30 PM 12/25/2022   10:01 AM 01/12/2023   10:08 AM 06/25/2023    9:04 AM 09/25/2024    2:32 PM  Fall Risk  Falls in the past year?  0 0 0 0  Was there  an injury with Fall?  0 0 0 0  Fall Risk Category Calculator  0 0 0 0  (RETIRED) Patient Fall Risk Level Low fall risk       Patient at Risk for Falls Due to  History of fall(s) No Fall Risks No Fall Risks No Fall Risks  Fall risk Follow up  Falls evaluation completed Falls evaluation completed Falls evaluation completed Falls evaluation completed     Data saved with a previous flowsheet row definition   Functional Status Survey:    Vitals:   09/25/24 1433 09/25/24 1442  BP: (!) 160/108 (!) 164/108  Pulse: 95   Resp: 20   Temp: 97.8 F (36.6 C)   SpO2: 97%   Weight: (!) 335 lb (152 kg)   Height: 5' 6 (1.676 m)    Body mass index is 54.07 kg/m. Physical Exam VITALS: BP- 160/108 MEASUREMENTS: Weight- 335, BMI- 54.59. GENERAL: Alert, cooperative, well developed, no acute distress. HEENT: Normocephalic, normal oropharynx, moist mucous membranes. CHEST: Clear to auscultation bilaterally, no wheezes, rhonchi, or crackles. CARDIOVASCULAR: Normal  heart rate and rhythm, S1 and S2 normal without murmurs. ABDOMEN: Soft, non-tender, non-distended, without organomegaly, normal bowel sounds. EXTREMITIES: No cyanosis or edema. MUSCULOSKELETAL: Bilateral knee crepitus. NEUROLOGICAL: Cranial nerves grossly intact, moves all extremities without gross motor or sensory deficit.  SKIN: No rash,no lesion or erythema   PSYCHIATRY/BEHAVIORAL: Mood stable   Labs reviewed: No results for input(s): NA, K, CL, CO2, GLUCOSE, BUN, CREATININE, CALCIUM , MG, PHOS in the last 8760 hours. No results for input(s): AST, ALT, ALKPHOS, BILITOT, PROT, ALBUMIN in the last 8760 hours. No results for input(s): WBC, NEUTROABS, HGB, HCT, MCV, PLT in the last 8760 hours. Lab Results  Component Value Date   TSH 4.11 12/20/2022   Lab Results  Component Value Date   HGBA1C 6.2 05/23/2024   Lab Results  Component Value Date   CHOL 203 (H) 12/20/2022   HDL 52 12/20/2022    LDLCALC 130 (H) 12/20/2022   TRIG 100 12/20/2022   CHOLHDL 3.9 12/20/2022    Significant Diagnostic Results in last 30 days:  MM 3D SCREENING MAMMOGRAM BILATERAL BREAST Result Date: 09/30/2024 CLINICAL DATA:  Screening. This is the patient's initial baseline mammogram. EXAM: DIGITAL SCREENING BILATERAL MAMMOGRAM WITH TOMOSYNTHESIS AND CAD TECHNIQUE: Bilateral screening digital craniocaudal and mediolateral oblique mammograms were obtained. Bilateral screening digital breast tomosynthesis was performed. The images were evaluated with computer-aided detection. COMPARISON:  None. ACR Breast Density Category b: There are scattered areas of fibroglandular density. FINDINGS: There are no findings suspicious for malignancy. IMPRESSION: No mammographic evidence of malignancy. A result letter of this screening mammogram will be mailed directly to the patient. RECOMMENDATION: Screening mammogram in one year. (Code:[id number]-B-01Y) BI-RADS CATEGORY  1: Negative. Electronically Signed   By: Debby Satterfield M.D.   On: 09/30/2024 15:58    Assessment/Plan  Essential hypertension Blood pressure is elevated at 160/108 mmHg due to running out of losartan  for a couple of weeks. No symptoms of dizziness or chest pain, but experienced a headache requiring BC powder. Insurance issues led to medication lapse. - Administered clonidine 0.1 mg in office to manage acute hypertension. - Prescribed losartan  50 mg daily with a 90-day supply and one refill. - Advised to take losartan  two hours after breakfast. - Will recheck blood pressure in two weeks to assess response to losartan . - Instructed to contact office if there are issues with medication access.  Type 2 diabetes mellitus Previous A1c was 6.5%, indicating diabetes. Weight has decreased by 3 pounds over the past year due to dietary changes. Discussed potential use of newer medications like Ozempic, Mounjaro, or Wegovy if A1c remains elevated, contingent on insurance  coverage. - Ordered A1c test to reassess diabetes control. - Discussed potential use of newer diabetes medications if A1c remains elevated.  Mixed hyperlipidemia Previous total cholesterol was 203 mg/dL, LDL was 869 mg/dL, and triglycerides were 100 mg/dL. Simvastatin  was not being filled due to lack of recent appointment. - Prescribed simvastatin  20 mg daily at bedtime. - Ordered lipid panel to reassess cholesterol levels.  Hypothyroidism Currently not taking levothyroxine  due to running out of medication. Importance of taking levothyroxine  on an empty stomach emphasized. - Prescribed levothyroxine  75 mcg daily. - Advised to take levothyroxine  on an empty stomach, preferably in the morning.  Obesity with associated comorbidity Type 2 DM,HTN,and HLD Weight has decreased from 338 pounds to 335 pounds over the past year. Dietary changes include reduced pork intake and increased consumption of turkey and chicken. No additional exercise reported beyond work-related activity. Discussed potential use of  newer medications for weight management if diabetes is confirmed. - Encouraged continuation of dietary changes. - Recommended adding 30 minutes of exercise three times a week. - Discussed potential use of newer weight management medications if diabetes is confirmed.  General Health Maintenance Due for mammogram as she has turned 38 years old. Discussed the importance of regular screenings and vaccinations. - Ordered mammogram at [facility]. - Administered flu shot after blood pressure was rechecked.   Family/ staff Communication: Reviewed plan of care with patient verbalized understanding   Labs/tests ordered: None   Next Appointment : Return in about 2 weeks (around 10/09/2024) for Blood pressure.   Spent 30 minutes of Face to face and non-face to face with patient  >50% time spent counseling; reviewing medical record; tests; labs; documentation and developing future plan of  care.   Roxan JAYSON Plough, NP

## 2024-10-09 ENCOUNTER — Encounter: Payer: Self-pay | Admitting: Family

## 2024-10-09 ENCOUNTER — Ambulatory Visit (INDEPENDENT_AMBULATORY_CARE_PROVIDER_SITE_OTHER): Payer: PRIVATE HEALTH INSURANCE | Admitting: Family

## 2024-10-09 ENCOUNTER — Ambulatory Visit: Payer: PRIVATE HEALTH INSURANCE | Admitting: Family

## 2024-10-09 ENCOUNTER — Other Ambulatory Visit: Payer: Self-pay

## 2024-10-09 ENCOUNTER — Other Ambulatory Visit: Payer: Self-pay | Admitting: Family

## 2024-10-09 VITALS — BP 138/88 | HR 85 | Temp 97.8°F | Resp 20 | Ht 66.0 in | Wt 336.8 lb

## 2024-10-09 DIAGNOSIS — I10 Essential (primary) hypertension: Secondary | ICD-10-CM

## 2024-10-09 DIAGNOSIS — Z6841 Body Mass Index (BMI) 40.0 and over, adult: Secondary | ICD-10-CM

## 2024-10-09 DIAGNOSIS — E039 Hypothyroidism, unspecified: Secondary | ICD-10-CM

## 2024-10-09 DIAGNOSIS — G4733 Obstructive sleep apnea (adult) (pediatric): Secondary | ICD-10-CM

## 2024-10-09 DIAGNOSIS — Z23 Encounter for immunization: Secondary | ICD-10-CM | POA: Diagnosis not present

## 2024-10-09 DIAGNOSIS — E119 Type 2 diabetes mellitus without complications: Secondary | ICD-10-CM

## 2024-10-09 DIAGNOSIS — E782 Mixed hyperlipidemia: Secondary | ICD-10-CM

## 2024-10-09 MED ORDER — TIRZEPATIDE-WEIGHT MANAGEMENT 2.5 MG/0.5ML ~~LOC~~ SOLN: 2.5000 mg | SUBCUTANEOUS | 3 refills | Status: DC

## 2024-10-09 NOTE — Progress Notes (Signed)
 Provider: Roxan Plough FNP-C  Jerzy Crotteau, Roxan BROCKS, NP  Patient Care Team: Omir Cooprider, Roxan BROCKS, NP as PCP - General (Family Medicine) Lilton Legions, DO as Consulting Physician (Obstetrics and Gynecology)  Extended Emergency Contact Information Primary Emergency Contact: Dikes,[name] Address: [address]          [place], [place] [address] [place] Phone: [phone] Relation: Spouse  Code Status: Full Code  Goals of care: Advanced Directive information    09/25/2024    2:32 PM  Advanced Directives  Does Patient Have a Medical Advance Directive? No  Would patient like information on creating a medical advance directive? No - Patient declined     Chief Complaint  Patient presents with   Follow-up    2 Week follow up for blood pressure.    Discussed the use of AI scribe software for clinical note transcription with the patient, who gave verbal consent to proceed.  History of Present Illness   [name] is a 38 year old female with hypertension who presents for a two-week follow-up.  She has not been consistently monitoring her blood pressure at home but noted an elevated reading last weekend when she experienced a headache. Her blood pressure normalized after taking her medication. At her last visit on October 30th, her blood pressure was significantly elevated at 164/108 mmHg, leading to the initiation of losartan  50 mg daily.  Today, her blood pressure is 138/88 mmHg. She denies any new medications or supplements and confirms current medications include losartan  50 mg daily, levothyroxine  75 mg daily, and simvastatin  20 mg daily at bedtime. She is no longer taking amlodipine .  She feels generally fine but experiences occasional chest 'fluttering' without pain, dizziness, or palpitations. No chest pain, dizziness, or palpitations.  Her diet has not significantly changed, though she has reduced pork intake and increased turkey consumption. She  identifies sweets, particularly cookies, as a dietary challenge, noting her daughter calls her 'the cookie monster'.  She has a history of sleep apnea and uses a CPAP machine. Her mother has a history of diabetes and congestive heart failure.  Past Medical History:  Diagnosis Date   Alcohol use disorder in remission    Anemia    Former cigarette smoker    Hypertension    Pregnancy induced hypertension    Sleep apnea    Past Surgical History:  Procedure Laterality Date   CESAREAN SECTION     CESAREAN SECTION WITH BILATERAL TUBAL LIGATION  03/02/2014   Procedure: Repeat CESAREAN SECTION WITH BILATERAL TUBAL LIGATION;  Surgeon: Olam Mill, MD;  Location: [facility];  Service: Obstetrics;;   DILATION AND CURETTAGE OF UTERUS     WISDOM TOOTH EXTRACTION      No Known Allergies  Outpatient Encounter Medications as of 10/09/2024  Medication Sig   levothyroxine  (SYNTHROID ) 75 MCG tablet Take 1 tablet (75 mcg total) by mouth daily before breakfast. TAKE ON AN EMPTY STOMACH 2 HOURS AWAY FROM OTHER MEDICATIONS   losartan  (COZAAR ) 50 MG tablet Take 1 tablet (50 mg total) by mouth daily. Needs an appointment before anymore future refills.   simvastatin  (ZOCOR ) 20 MG tablet Take 1 tablet (20 mg total) by mouth at bedtime. E78.2   [DISCONTINUED] tirzepatide  (ZEPBOUND ) 2.5 MG/0.5ML injection vial Inject 2.5 mg into the skin once a week.   [DISCONTINUED] amLODipine  (NORVASC ) 5 MG tablet Take 1 tablet (5 mg total) by mouth daily. (Patient not taking: Reported on 04/07/2021)   [DISCONTINUED] valsartan (DIOVAN) 160 MG  tablet Take 160 mg by mouth daily. (Patient not taking: Reported on 04/07/2021)   No facility-administered encounter medications on file as of 10/09/2024.    Review of Systems  Constitutional:  Negative for appetite change, chills, fatigue, fever and unexpected weight change.  HENT:  Negative for congestion, dental problem, ear discharge, ear pain, hearing loss, nosebleeds, postnasal  drip, rhinorrhea, sinus pressure, sinus pain, sneezing, sore throat, tinnitus and trouble swallowing.   Eyes:  Negative for pain, discharge, redness, itching and visual disturbance.  Respiratory:  Negative for cough, chest tightness, shortness of breath and wheezing.   Cardiovascular:  Negative for chest pain, palpitations and leg swelling.  Gastrointestinal:  Negative for abdominal distention, abdominal pain, blood in stool, constipation, diarrhea, nausea and vomiting.  Endocrine: Negative for cold intolerance, heat intolerance, polydipsia, polyphagia and polyuria.  Genitourinary:  Negative for difficulty urinating, dysuria, flank pain, frequency and urgency.  Musculoskeletal:  Negative for arthralgias, back pain, gait problem, joint swelling, myalgias, neck pain and neck stiffness.  Skin:  Negative for color change, pallor, rash and wound.  Neurological:  Negative for dizziness, syncope, speech difficulty, weakness, light-headedness, numbness and headaches.  Hematological:  Does not bruise/bleed easily.  Psychiatric/Behavioral:  Negative for agitation, behavioral problems, confusion, hallucinations, self-injury, sleep disturbance and suicidal ideas. The patient is not nervous/anxious.     Immunization History  Administered Date(s) Administered   Influenza Inj Mdck Quad Pf 08/20/2018   Influenza, Seasonal, Injecte, Preservative Fre 10/09/2024   Influenza-Unspecified 08/27/2022   PFIZER(Purple Top)SARS-COV-2 Vaccination 02/05/2020, 02/26/2020   Td (Adult),5 Lf Tetanus Toxid, Preservative Free 09/28/2022   Tdap 12/09/2020   Pertinent  Health Maintenance Due  Topic Date Due   Mammogram  09/26/2026   Influenza Vaccine  Completed      11/22/2022   12:30 PM 12/25/2022   10:01 AM 01/12/2023   10:08 AM 06/25/2023    9:04 AM 09/25/2024    2:32 PM  Fall Risk  Falls in the past year?  0 0 0 0  Was there an injury with Fall?  0 0 0 0  Fall Risk Category Calculator  0 0 0 0  (RETIRED) Patient  Fall Risk Level Low fall risk       Patient at Risk for Falls Due to  History of fall(s) No Fall Risks No Fall Risks No Fall Risks  Fall risk Follow up  Falls evaluation completed Falls evaluation completed Falls evaluation completed Falls evaluation completed     Data saved with a previous flowsheet row definition   Functional Status Survey:    Vitals:   10/09/24 1445  BP: 138/88  Pulse: 85  Resp: 20  Temp: 97.8 F (36.6 C)  SpO2: 98%  Weight: (!) 336 lb 12.8 oz (152.8 kg)  Height: 5' 6 (1.676 m)   Body mass index is 54.36 kg/m. Physical Exam  VITALS: T- 97.8, BP- 138/88 MEASUREMENTS: Weight- 336, BMI- 54.0. GENERAL: Alert, cooperative, well developed, no acute distress. HEENT: Normocephalic, normal oropharynx, moist mucous membranes. CHEST: Clear to auscultation bilaterally, no wheezes, rhonchi, or crackles. No tenderness on back. CARDIOVASCULAR: Normal heart rate and rhythm, S1 and S2 normal without murmurs. ABDOMEN: Soft, non-tender, non-distended, without organomegaly, normal bowel sounds. EXTREMITIES: No cyanosis, edema, or swelling in legs. NEUROLOGICAL: Cranial nerves grossly intact, moves all extremities without gross motor or sensory deficit.  SKIN: No rash,no lesion or erythema   PSYCHIATRY/BEHAVIORAL: Mood stable   Labs reviewed: Recent Labs    10/09/24 1418  NA 140  K 4.3  CL 107  CO2 26  GLUCOSE 93  BUN 9  CREATININE 0.72  CALCIUM  8.6   Recent Labs    10/09/24 1418  AST 9*  ALT 7  BILITOT 0.2  PROT 6.6   Recent Labs    10/09/24 1418  WBC 6.2  NEUTROABS 2,691  HGB 8.5*  HCT 31.4*  MCV 67.4*  PLT 464*   Lab Results  Component Value Date   TSH 2.82 10/09/2024   Lab Results  Component Value Date   HGBA1C 6.1 (H) 10/09/2024   Lab Results  Component Value Date   CHOL 189 10/09/2024   HDL 46 (L) 10/09/2024   LDLCALC 123 (H) 10/09/2024   TRIG 98 10/09/2024   CHOLHDL 4.1 10/09/2024    Significant Diagnostic Results in last 30  days:  MM 3D SCREENING MAMMOGRAM BILATERAL BREAST Result Date: 09/30/2024 CLINICAL DATA:  Screening. This is the patient's initial baseline mammogram. EXAM: DIGITAL SCREENING BILATERAL MAMMOGRAM WITH TOMOSYNTHESIS AND CAD TECHNIQUE: Bilateral screening digital craniocaudal and mediolateral oblique mammograms were obtained. Bilateral screening digital breast tomosynthesis was performed. The images were evaluated with computer-aided detection. COMPARISON:  None. ACR Breast Density Category b: There are scattered areas of fibroglandular density. FINDINGS: There are no findings suspicious for malignancy. IMPRESSION: No mammographic evidence of malignancy. A result letter of this screening mammogram will be mailed directly to the patient. RECOMMENDATION: Screening mammogram in one year. (Code:SM-B-01Y) BI-RADS CATEGORY  1: Negative. Electronically Signed   By: Debby Satterfield M.D.   On: 09/30/2024 15:58   Assessment/Plan  Essential hypertension Blood pressure improved from 164/108 mmHg to 138/88 mmHg with losartan  50 mg daily. Goal is less than 130/80 mmHg. - Continue losartan  50 mg daily. - Monitor blood pressure at home and report if consistently above 140/90 mmHg. - Encouraged dietary modifications, including reducing sweets and increasing fruit intake.  Obesity, BMI 50.0-59.9 BMI remains high at 54. Discussed weight management and potential use of Zepbond for weight loss, diabetes, and sleep apnea. Insurance coverage for Zepbondis pending approval. Discussed potential side effects of Zepbond, including nausea and constipation. - Prescribed Zepbond 2.5 mg weekly, pending insurance approval. - Scheduled follow-up in one month to assess tolerance and effectiveness of Zepbond  Type 2 diabetes mellitus Last A1c was 6.2% in June, indicating well-controlled diabetes. Discussed potential benefits of Z-Bound for diabetes management. - Ordered A1c test to assess current diabetes control. - Prescribed  Z-Bound 2.5 mg weekly, pending insurance approval.  Obstructive sleep apnea Diagnosed with obstructive sleep apnea and uses CPAP. Discussed potential benefits of Z-Bound for sleep apnea management. - Prescribed Z-Bound 2.5 mg weekly, pending insurance approval.   Family/ staff Communication: Reviewed plan of care with patient verbalized understanding  Labs/tests ordered: None   Next Appointment: Return in about 4 months (around 02/06/2025) for medical mangement of chronic issues.One month for weight check .   Total time: minutes. Greater than 50% of total time spent doing patient education regarding T2DM,HTN,OSA,Obesity,health maintenance including symptom/medication management.   Roxan JAYSON Plough, NP

## 2024-10-10 ENCOUNTER — Telehealth: Payer: Self-pay

## 2024-10-10 LAB — HEMOGLOBIN A1C
Hgb A1c MFr Bld: 6.1 % — ABNORMAL HIGH (ref <5.7)
Mean Plasma Glucose: 128 mg/dL
eAG (mmol/L): 7.1 mmol/L

## 2024-10-10 LAB — CBC WITH DIFFERENTIAL/PLATELET
Absolute Lymphocytes: 2802 {cells}/uL (ref 850–3900)
Absolute Monocytes: 471 {cells}/uL (ref 200–950)
Basophils Absolute: 37 {cells}/uL (ref 0–200)
Basophils Relative: 0.6 %
Eosinophils Absolute: 198 {cells}/uL (ref 15–500)
Eosinophils Relative: 3.2 %
HCT: 31.4 % — ABNORMAL LOW (ref 35.0–45.0)
Hemoglobin: 8.5 g/dL — ABNORMAL LOW (ref 11.7–15.5)
MCH: 18.2 pg — ABNORMAL LOW (ref 27.0–33.0)
MCHC: 27.1 g/dL — ABNORMAL LOW (ref 32.0–36.0)
MCV: 67.4 fL — ABNORMAL LOW (ref 80.0–100.0)
MPV: 9.4 fL (ref 7.5–12.5)
Monocytes Relative: 7.6 %
Neutro Abs: 2691 {cells}/uL (ref 1500–7800)
Neutrophils Relative %: 43.4 %
Platelets: 464 Thousand/uL — ABNORMAL HIGH (ref 140–400)
RBC: 4.66 Million/uL (ref 3.80–5.10)
RDW: 17.6 % — ABNORMAL HIGH (ref 11.0–15.0)
Total Lymphocyte: 45.2 %
WBC: 6.2 Thousand/uL (ref 3.8–10.8)

## 2024-10-10 LAB — COMPLETE METABOLIC PANEL WITHOUT GFR
AG Ratio: 1.4 (calc) (ref 1.0–2.5)
ALT: 7 U/L (ref 6–29)
AST: 9 U/L — ABNORMAL LOW (ref 10–30)
Albumin: 3.8 g/dL (ref 3.6–5.1)
Alkaline phosphatase (APISO): 63 U/L (ref 31–125)
BUN: 9 mg/dL (ref 7–25)
CO2: 26 mmol/L (ref 20–32)
Calcium: 8.6 mg/dL (ref 8.6–10.2)
Chloride: 107 mmol/L (ref 98–110)
Creat: 0.72 mg/dL (ref 0.50–0.99)
Globulin: 2.8 g/dL (ref 1.9–3.7)
Glucose, Bld: 93 mg/dL (ref 65–139)
Potassium: 4.3 mmol/L (ref 3.5–5.3)
Sodium: 140 mmol/L (ref 135–146)
Total Bilirubin: 0.2 mg/dL (ref 0.2–1.2)
Total Protein: 6.6 g/dL (ref 6.1–8.1)

## 2024-10-10 LAB — CBC MORPHOLOGY

## 2024-10-10 LAB — MICROALBUMIN / CREATININE URINE RATIO
Creatinine, Urine: 335 mg/dL — ABNORMAL HIGH (ref 20–275)
Microalb Creat Ratio: 3 mg/g{creat} (ref <30)
Microalb, Ur: 1 mg/dL

## 2024-10-10 LAB — LIPID PANEL
Cholesterol: 189 mg/dL (ref <200)
HDL: 46 mg/dL — ABNORMAL LOW (ref >=50)
LDL Cholesterol (Calc): 123 mg/dL — ABNORMAL HIGH
Non-HDL Cholesterol (Calc): 143 mg/dL — ABNORMAL HIGH (ref <130)
Total CHOL/HDL Ratio: 4.1 (calc) (ref <5.0)
Triglycerides: 98 mg/dL (ref <150)

## 2024-10-10 LAB — TSH: TSH: 2.82 m[IU]/L

## 2024-10-10 NOTE — Telephone Encounter (Signed)
 Copied from CRM #[id number]. Topic: Clinical - Lab/Test Results >> Oct 10, 2024  1:31 PM DeAngela L wrote: Reason for CRM: patient calling about lab results in MyChart and she would like a better understanding from her provider of the results  Informed the patient the provider have not read the results yet  Pt num [phone] (H)  Message sent to Mast, Man X, NP (one of the providers that covering for a provider)

## 2024-10-13 ENCOUNTER — Ambulatory Visit: Payer: Self-pay | Admitting: Family

## 2024-10-13 NOTE — Telephone Encounter (Signed)
 Copied from CRM #[id number]. Topic: Clinical - Lab/Test Results >> Oct 13, 2024  3:08 PM Farrel B wrote: Reason for CRM: [phone] (M) Ms [name] stated she saw the results just needed further explanation off labs in detail    Awaiting for Provider to review.

## 2024-10-13 NOTE — Telephone Encounter (Signed)
 See lab results.

## 2024-10-15 ENCOUNTER — Telehealth: Payer: Self-pay

## 2024-10-15 DIAGNOSIS — Z6841 Body Mass Index (BMI) 40.0 and over, adult: Secondary | ICD-10-CM

## 2024-10-15 DIAGNOSIS — E119 Type 2 diabetes mellitus without complications: Secondary | ICD-10-CM

## 2024-10-15 NOTE — Telephone Encounter (Signed)
 Patient called to say that she was told by [facility] that they would be reaching out to us  for further information about the zepbound .  I called pharmacy, spoke with Natalie and she stated the Zepboud is not covered and the insurance is suggesting change to an alternative:  Wegovy  Saxenda 

## 2024-10-16 ENCOUNTER — Other Ambulatory Visit: Payer: Self-pay | Admitting: Family

## 2024-10-16 DIAGNOSIS — Z6841 Body Mass Index (BMI) 40.0 and over, adult: Secondary | ICD-10-CM

## 2024-10-16 DIAGNOSIS — E119 Type 2 diabetes mellitus without complications: Secondary | ICD-10-CM

## 2024-10-16 MED ORDER — WEGOVY 0.25 MG/0.5ML ~~LOC~~ SOAJ: 0.2500 mg | SUBCUTANEOUS | 3 refills | Status: DC

## 2024-10-16 NOTE — Telephone Encounter (Signed)
-   discontinue Zepound.Wegovy 0.25 mg inject once a week.Prescription sent to pharmacy.

## 2024-10-17 ENCOUNTER — Other Ambulatory Visit: Payer: Self-pay | Admitting: Family

## 2024-10-17 ENCOUNTER — Other Ambulatory Visit: Payer: Self-pay

## 2024-10-17 DIAGNOSIS — Z6841 Body Mass Index (BMI) 40.0 and over, adult: Secondary | ICD-10-CM

## 2024-10-17 DIAGNOSIS — G4733 Obstructive sleep apnea (adult) (pediatric): Secondary | ICD-10-CM

## 2024-10-17 MED ORDER — LIRAGLUTIDE -WEIGHT MANAGEMENT 18 MG/3ML ~~LOC~~ SOPN: 0.6000 mg | PEN_INJECTOR | SUBCUTANEOUS | 3 refills | Status: DC

## 2024-10-17 NOTE — Telephone Encounter (Signed)
 Prescription sent

## 2024-10-17 NOTE — Telephone Encounter (Signed)
 Wegovy  not covered by patient's insurance pr pharmacy. Please fax Saxenda  prescription to pharmacy.

## 2024-10-17 NOTE — Telephone Encounter (Signed)
 Discontinue Liraglutide  if not covered by insurance.  Recommend metformin  500 mg tablet one by mouth daily.

## 2024-10-17 NOTE — Telephone Encounter (Signed)
 May check if available in other pharmacy then send prescription.

## 2024-10-17 NOTE — Telephone Encounter (Signed)
 Copied from CRM #[id number]. Topic: Clinical - Prescription Issue >> Oct 17, 2024  2:23 PM Cherylann RAMAN wrote: Reason for CRM: Patient called due to receiving texts from pharmacy regarding a prescription issue. Patient was requesting information regarding it. Informed her that she would need to call her pharmacy for the information. >> Oct 17, 2024  2:40 PM DeAngela L wrote: patient states the pharmacy informed her the prescription alternative still not accepted by her insurance provider and the pharmacy states they will send the prescription back to the doctors cause they are not sure why it's not approved   Pt num [phone] (H)   [id number] [place] [place], [place] - [address] [place] [address] Phone: [phone] Fax: [phone]

## 2024-10-17 NOTE — Telephone Encounter (Signed)
 Saxenda  is on back order

## 2024-10-17 NOTE — Telephone Encounter (Signed)
 Please fax prescription for Saxenda  to pharmacy

## 2024-10-17 NOTE — Telephone Encounter (Signed)
 Please advise

## 2024-10-20 NOTE — Telephone Encounter (Signed)
 Dinah please confirm Metformin  is the Metformin  ER

## 2024-10-21 ENCOUNTER — Telehealth: Payer: Self-pay

## 2024-10-21 MED ORDER — METFORMIN HCL ER 500 MG PO TB24: 500.0000 mg | ORAL_TABLET | Freq: Every day | ORAL | 5 refills | Status: DC

## 2024-10-21 NOTE — Telephone Encounter (Unsigned)
 Copied from CRM #[id number]. Topic: Clinical - Medication Question >> Oct 21, 2024  9:40 AM [name] wrote: Patient called ans stated she is not taking the metFORMIN  (GLUCOPHAGE -XR) 500 MG 24 hr tablet.   Patient would like an alternative solution

## 2024-10-21 NOTE — Telephone Encounter (Signed)
 Please schedule appointment to discuss options for metformin .

## 2024-10-22 NOTE — Telephone Encounter (Signed)
Mychart message sent to patient with providers response.

## 2024-11-10 ENCOUNTER — Other Ambulatory Visit: Payer: Self-pay | Admitting: Family

## 2024-11-10 ENCOUNTER — Ambulatory Visit: Payer: PRIVATE HEALTH INSURANCE | Admitting: Family

## 2024-11-10 ENCOUNTER — Encounter: Payer: Self-pay | Admitting: Family

## 2024-11-10 VITALS — BP 140/80 | HR 99 | Temp 98.7°F | Resp 20 | Ht 66.0 in | Wt 339.8 lb

## 2024-11-10 DIAGNOSIS — G4733 Obstructive sleep apnea (adult) (pediatric): Secondary | ICD-10-CM

## 2024-11-10 DIAGNOSIS — Z6841 Body Mass Index (BMI) 40.0 and over, adult: Secondary | ICD-10-CM

## 2024-11-10 MED ORDER — ZEPBOUND 2.5 MG/0.5ML ~~LOC~~ SOAJ: 2.5000 mg | SUBCUTANEOUS | 1 refills | Status: DC

## 2024-11-10 NOTE — Telephone Encounter (Unsigned)
 Copied from CRM #[id number]. Topic: Clinical - Prescription Issue >> Nov 10, 2024  3:10 PM Fredrica W wrote: Reason for CRM: Patient called - states received message from her pharmacy - requires updated Rx for Zepbound . Asked if it was PA - she read the message again - it just states updated Rx. Thank You

## 2024-11-10 NOTE — Progress Notes (Signed)
 "  Provider: Montie Gelardi FNP-C   [name], Tabitha BROCKS, NP  Patient Care Team: Doyce Saling, Tabitha BROCKS, NP as PCP - General (Family Medicine) Lilton Legions, DO as Consulting Physician (Obstetrics and Gynecology)  Extended Emergency Contact Information Primary Emergency Contact: [name],[name] Address: [address]          [place], [place] [address] [place] Phone: [phone] Relation: Spouse  Code Status:  Full Code  Goals of care: Advanced Directive information    09/25/2024    2:32 PM  Advanced Directives  Does Patient Have a Medical Advance Directive? No  Would patient like information on creating a medical advance directive? No - Patient declined     Chief Complaint  Patient presents with   Follow-up    1 Month follow up for weight.    HPI:  Pt is a 38 y.o. female seen today for one months follow up for weight check  she was here on 10/09/2024 Zepbound  2.5 mg inject weekly was prescribed but states insurance did not cover.Insurance recommended metformin  but patient declined states scared to take because of what metformin  did to her mother who had diabetes.  She has a significant medical history of prediabetes,Obstructive sleep apnea and Morbid obesity,  Past Medical History:  Diagnosis Date   Alcohol use disorder in remission    Anemia    Former cigarette smoker    Hypertension    Pregnancy induced hypertension    Sleep apnea    Past Surgical History:  Procedure Laterality Date   CESAREAN SECTION     CESAREAN SECTION WITH BILATERAL TUBAL LIGATION  03/02/2014   Procedure: Repeat CESAREAN SECTION WITH BILATERAL TUBAL LIGATION;  Surgeon: Olam Mill, MD;  Location: [facility];  Service: Obstetrics;;   DILATION AND CURETTAGE OF UTERUS     WISDOM TOOTH EXTRACTION      Allergies[1]  Allergies as of 11/10/2024   No Known Allergies      Medication List        Accurate as of November 10, 2024  2:47 PM. If you have any questions, ask your  nurse or doctor.          levothyroxine  75 MCG tablet Commonly known as: SYNTHROID  Take 1 tablet (75 mcg total) by mouth daily before breakfast. TAKE ON AN EMPTY STOMACH 2 HOURS AWAY FROM OTHER MEDICATIONS   Liraglutide  -Weight Management 18 MG/3ML Sopn Inject 0.6 mg into the skin once a week.   losartan  50 MG tablet Commonly known as: COZAAR  Take 1 tablet (50 mg total) by mouth daily. Needs an appointment before anymore future refills.   metFORMIN  500 MG 24 hr tablet Commonly known as: GLUCOPHAGE -XR Take 1 tablet (500 mg total) by mouth daily with breakfast.   simvastatin  20 MG tablet Commonly known as: ZOCOR  Take 1 tablet (20 mg total) by mouth at bedtime. E78.2        Review of Systems  Constitutional:  Negative for appetite change, chills, fatigue, fever and unexpected weight change.  HENT:  Negative for congestion, dental problem, ear discharge, ear pain, facial swelling, hearing loss, nosebleeds, postnasal drip, rhinorrhea, sinus pressure, sinus pain, sneezing, sore throat, tinnitus and trouble swallowing.   Eyes:  Negative for pain, discharge, redness, itching and visual disturbance.  Respiratory:  Negative for cough, chest tightness, shortness of breath and wheezing.   Cardiovascular:  Negative for chest pain, palpitations and leg swelling.  Gastrointestinal:  Negative for abdominal distention, abdominal pain, blood in stool, constipation, diarrhea, nausea and  vomiting.  Endocrine: Negative for cold intolerance, heat intolerance, polydipsia, polyphagia and polyuria.  Genitourinary:  Negative for difficulty urinating, dysuria, flank pain, frequency and urgency.  Musculoskeletal:  Negative for arthralgias, back pain, gait problem, joint swelling, myalgias, neck pain and neck stiffness.  Skin:  Negative for color change, pallor, rash and wound.  Neurological:  Negative for dizziness, syncope, speech difficulty, weakness, light-headedness, numbness and headaches.   Hematological:  Does not bruise/bleed easily.  Psychiatric/Behavioral:  Negative for agitation, behavioral problems, confusion, hallucinations, self-injury, sleep disturbance and suicidal ideas. The patient is not nervous/anxious.     Immunization History  Administered Date(s) Administered   Influenza Inj Mdck Quad Pf 08/20/2018   Influenza, Seasonal, Injecte, Preservative Fre 10/09/2024   Influenza-Unspecified 08/27/2022   PFIZER(Purple Top)SARS-COV-2 Vaccination 02/05/2020, 02/26/2020   Td (Adult),5 Lf Tetanus Toxid, Preservative Free 09/28/2022   Tdap 12/09/2020   Pertinent  Health Maintenance Due  Topic Date Due   Mammogram  09/26/2026   Influenza Vaccine  Completed      11/22/2022   12:30 PM 12/25/2022   10:01 AM 01/12/2023   10:08 AM 06/25/2023    9:04 AM 09/25/2024    2:32 PM  Fall Risk  Falls in the past year?  0 0 0 0  Was there an injury with Fall?  0  0  0  0   Fall Risk Category Calculator  0 0 0 0  (RETIRED) Patient Fall Risk Level Low fall risk       Patient at Risk for Falls Due to  History of fall(s) No Fall Risks No Fall Risks No Fall Risks  Fall risk Follow up  Falls evaluation completed Falls evaluation completed Falls evaluation completed Falls evaluation completed     Data saved with a previous flowsheet row definition   Functional Status Survey:    Vitals:   11/10/24 1425 11/10/24 1444  BP: (!) 144/84 (!) 140/80  Pulse: 99   Resp: 20   Temp: 98.7 F (37.1 C)   SpO2: 99%   Weight: (!) 339 lb 12.8 oz (154.1 kg)   Height: 5' 6 (1.676 m)    Body mass index is 54.85 kg/m. Physical Exam Vitals reviewed.  Constitutional:      General: She is not in acute distress.    Appearance: Normal appearance. She is morbidly obese. She is not ill-appearing or diaphoretic.  HENT:     Head: Normocephalic.  Eyes:     General: No scleral icterus.       Right eye: No discharge.        Left eye: No discharge.     Extraocular Movements: Extraocular movements  intact.     Conjunctiva/sclera: Conjunctivae normal.     Pupils: Pupils are equal, round, and reactive to light.  Neck:     Vascular: No carotid bruit.  Cardiovascular:     Rate and Rhythm: Normal rate and regular rhythm.     Pulses: Normal pulses.     Heart sounds: Normal heart sounds. No murmur heard.    No friction rub. No gallop.  Pulmonary:     Effort: Pulmonary effort is normal. No respiratory distress.     Breath sounds: Normal breath sounds. No wheezing, rhonchi or rales.  Chest:     Chest wall: No tenderness.  Abdominal:     General: Bowel sounds are normal. There is no distension.     Palpations: Abdomen is soft. There is no mass.     Tenderness: There is no  abdominal tenderness. There is no right CVA tenderness, left CVA tenderness, guarding or rebound.  Musculoskeletal:        General: No swelling or tenderness. Normal range of motion.     Cervical back: Normal range of motion. No rigidity or tenderness.     Right lower leg: No edema.     Left lower leg: No edema.  Lymphadenopathy:     Cervical: No cervical adenopathy.  Skin:    General: Skin is warm and dry.     Coloration: Skin is not pale.     Findings: No bruising, erythema, lesion or rash.  Neurological:     Mental Status: She is alert and oriented to person, place, and time.     Cranial Nerves: No cranial nerve deficit.     Sensory: No sensory deficit.     Motor: No weakness.     Coordination: Coordination normal.     Gait: Gait normal.  Psychiatric:        Mood and Affect: Mood normal.        Speech: Speech normal.        Behavior: Behavior normal.     Labs reviewed: Recent Labs    10/09/24 1418  NA 140  K 4.3  CL 107  CO2 26  GLUCOSE 93  BUN 9  CREATININE 0.72  CALCIUM  8.6   Recent Labs    10/09/24 1418  AST 9*  ALT 7  BILITOT 0.2  PROT 6.6   Recent Labs    10/09/24 1418  WBC 6.2  NEUTROABS 2,691  HGB 8.5*  HCT 31.4*  MCV 67.4*  PLT 464*   Lab Results  Component Value  Date   TSH 2.82 10/09/2024   Lab Results  Component Value Date   HGBA1C 6.1 (H) 10/09/2024   Lab Results  Component Value Date   CHOL 189 10/09/2024   HDL 46 (L) 10/09/2024   LDLCALC 123 (H) 10/09/2024   TRIG 98 10/09/2024   CHOLHDL 4.1 10/09/2024    Significant Diagnostic Results in last 30 days:  No results found.  Assessment/Plan 1. BMI 50.0-59.9, adult (HCC) (Primary) BMI 54.85 - dietary modification and exercise advised  - zepbound  ordered but not covered by insurance will refer to weight management  - Amb Ref to Medical Weight Management  2. Obstructive sleep apnea Continue on C-pap machine at bedtime - Amb Ref to Medical Weight Management  Family/ staff Communication: Reviewed plan of care with patient verbalized understanding   Labs/tests ordered: None   Next Appointment : Return in about 1 month (around 12/11/2024) for weight check .    Spent 20 minutes of Face to face and non-face to face with patient  >50% time spent counseling; reviewing medical record; tests; labs; documentation and developing future plan of care.   [name] Gordan Grell, NP      [1] No Known Allergies  "

## 2024-11-10 NOTE — Telephone Encounter (Signed)
 Obtain Prior Authorization due to Obstruction sleep apnea and Obesity.

## 2024-11-11 ENCOUNTER — Other Ambulatory Visit: Payer: Self-pay | Admitting: Family

## 2024-11-11 DIAGNOSIS — Z6841 Body Mass Index (BMI) 40.0 and over, adult: Secondary | ICD-10-CM

## 2024-11-11 DIAGNOSIS — G4733 Obstructive sleep apnea (adult) (pediatric): Secondary | ICD-10-CM

## 2024-11-11 NOTE — Telephone Encounter (Signed)
 See pharmacy note:  Pharmacy comment: Alternative Requested:THE PRESCRIBED MEDICATION IS NOT COVERED BY INSURANCE. PLEASE CONSIDER CHANGING TO ONE OF THE SUGGESTED COVERED ALTERNATIVES.   All Pharmacy Suggested Alternatives:  Liraglutide  -Weight Management 18 MG/3ML SOPN semaglutide -weight management (WEGOVY ) 0.25 MG/0.5ML SOAJ SQ injection Liraglutide  -Weight Management (SAXENDA ) 18 MG/3ML SOPN     Please advise

## 2024-11-12 MED ORDER — OZEMPIC (0.25 OR 0.5 MG/DOSE) 2 MG/3ML ~~LOC~~ SOPN: 0.2500 mg | PEN_INJECTOR | SUBCUTANEOUS | 3 refills | Status: AC

## 2024-11-12 NOTE — Telephone Encounter (Signed)
 Patient called back stating that her insurance company told her they will cover Ozempic , however a Prior Authorization will be needed (can call [phone]).  I explained that we will have Dinah send a script to the pharmacy and the pharmacy will notify us  by fax (covermymeds) to complete prior authorization electronically.

## 2024-11-12 NOTE — Addendum Note (Signed)
 Addended byBETHA LEONARDA BURDOCK C on: 11/12/2024 05:10 PM   Modules accepted: Orders

## 2024-11-12 NOTE — Telephone Encounter (Signed)
 Ozempic  0.25 mg injection prescription sent to pharmacy.

## 2024-11-12 NOTE — Telephone Encounter (Signed)
 Patient has been notified saxenda  is not covered by insurance. She was instructed to call insurance to see what alternatives are available and then to call our office with the options for provider to decide what is her best option.

## 2024-11-13 ENCOUNTER — Telehealth: Payer: Self-pay

## 2024-11-13 NOTE — Telephone Encounter (Signed)
 Incoming fax received from patients pharmacy to initiate a prior authorization for  Ozempic            .  PA initiated through covermymeds. Key: [id number]  Awaiting reply from the insurance company which will be determined in 48-72 hours.

## 2024-11-26 ENCOUNTER — Telehealth: Payer: Self-pay

## 2024-11-26 NOTE — Telephone Encounter (Signed)
 Copied from CRM #[id number]. Topic: Clinical - Prescription Issue >> Nov 26, 2024  9:05 AM Susanna ORN wrote: Reason for CRM: Patient called stating that she got a message from the pharmacy stating that she needs a PA for the Semaglutide ,0.25 or 0.5MG /DOS, (OZEMPIC , 0.25 OR 0.5 MG/DOSE,) 2 MG/3ML SOPN. Informed patient that as of now, we are awaiting a decision from the insurance company.

## 2024-11-26 NOTE — Telephone Encounter (Signed)
 Spoke with patient to advise that prior authorization has been sent

## 2024-12-11 ENCOUNTER — Encounter: Admitting: Family

## 2024-12-22 NOTE — Progress Notes (Signed)
   This encounter was created in error - please disregard. No show

## 2024-12-25 ENCOUNTER — Other Ambulatory Visit: Payer: Self-pay

## 2024-12-25 ENCOUNTER — Emergency Department (HOSPITAL_BASED_OUTPATIENT_CLINIC_OR_DEPARTMENT_OTHER)
Admission: EM | Admit: 2024-12-25 | Discharge: 2024-12-25 | Disposition: A | Discharge status: Home / Self Care | Attending: Emergency Medicine | Admitting: Emergency Medicine

## 2024-12-25 ENCOUNTER — Encounter (HOSPITAL_BASED_OUTPATIENT_CLINIC_OR_DEPARTMENT_OTHER): Payer: Self-pay

## 2024-12-25 DIAGNOSIS — I10 Essential (primary) hypertension: Secondary | ICD-10-CM | POA: Diagnosis present

## 2024-12-25 DIAGNOSIS — Z79899 Other long term (current) drug therapy: Secondary | ICD-10-CM | POA: Diagnosis not present

## 2024-12-25 DIAGNOSIS — I160 Hypertensive urgency: Secondary | ICD-10-CM

## 2024-12-25 LAB — CBC WITH DIFFERENTIAL/PLATELET
Abs Immature Granulocytes: 0.01 10*3/uL (ref 0.00–0.07)
Basophils Absolute: 0.1 10*3/uL (ref 0.0–0.1)
Basophils Relative: 1 %
Eosinophils Absolute: 0.2 10*3/uL (ref 0.0–0.5)
Eosinophils Relative: 3 %
HCT: 31 % — ABNORMAL LOW (ref 36.0–46.0)
Hemoglobin: 8.4 g/dL — ABNORMAL LOW (ref 12.0–15.0)
Immature Granulocytes: 0 %
Lymphocytes Relative: 42 %
Lymphs Abs: 2.8 10*3/uL (ref 0.7–4.0)
MCH: 18.1 pg — ABNORMAL LOW (ref 26.0–34.0)
MCHC: 27.1 g/dL — ABNORMAL LOW (ref 30.0–36.0)
MCV: 67 fL — ABNORMAL LOW (ref 80.0–100.0)
Monocytes Absolute: 0.6 10*3/uL (ref 0.1–1.0)
Monocytes Relative: 8 %
Neutro Abs: 3.1 10*3/uL (ref 1.7–7.7)
Neutrophils Relative %: 46 %
Platelets: 475 10*3/uL — ABNORMAL HIGH (ref 150–400)
RBC: 4.63 MIL/uL (ref 3.87–5.11)
RDW: 19.9 % — ABNORMAL HIGH (ref 11.5–15.5)
WBC: 6.8 10*3/uL (ref 4.0–10.5)
nRBC: 0 % (ref 0.0–0.2)

## 2024-12-25 LAB — BASIC METABOLIC PANEL WITH GFR
Anion gap: 10 (ref 5–15)
BUN: 9 mg/dL (ref 6–20)
CO2: 25 mmol/L (ref 22–32)
Calcium: 8.9 mg/dL (ref 8.9–10.3)
Chloride: 105 mmol/L (ref 98–111)
Creatinine, Ser: 0.73 mg/dL (ref 0.44–1.00)
GFR, Estimated: >60 mL/min
Glucose, Bld: 110 mg/dL — ABNORMAL HIGH (ref 70–99)
Potassium: 3.8 mmol/L (ref 3.5–5.1)
Sodium: 139 mmol/L (ref 135–145)

## 2024-12-25 MED ORDER — CLONIDINE HCL 0.1 MG PO TABS
0.1000 mg | ORAL_TABLET | Freq: Once | ORAL | Status: AC
  Administered 2024-12-25: 0.1 mg via ORAL
  Filled 2024-12-25: qty 1

## 2024-12-25 NOTE — ED Triage Notes (Signed)
 Pt reports BP has been high for the past week, it got to it's highest on waking this morning with SBP 171. Pt reports she is on antihypertensive medication and has been taking it as prescribed. No recent changes in medication. Intermittent headache over past week. 7/10 headache at this time. Intermittent dizziness, none at this time. Denies vision changes.

## 2024-12-25 NOTE — Discharge Instructions (Signed)
 If your blood sugars remain greater than 160-170 systolic, increase your dose of losartan  to 100 mg daily.  Keep a record of your blood pressures at home and follow-up with your primary doctor in the next 1 to 2 weeks to go over the results.

## 2024-12-25 NOTE — ED Provider Notes (Signed)
 " [place] EMERGENCY DEPARTMENT AT [facility] Provider Note   CSN: [id number] Arrival date & time: 12/25/24  9676     Patient presents with: Hypertension   [name] is a 39 y.o. female.   Patient is a 39 year old female with history of hypertension, obstructive sleep apnea.  Patient presenting today with complaints of elevated blood pressure.  She has been checking her blood pressure throughout the week and getting readings in the 170-180 range.  This is much higher than normal and she reports being compliant with her losartan .  She does report some headache, but denies any chest pain or difficulty breathing.  No fevers or chills.  No recent illness.  She denies any increased stress either physical or mental.       Prior to Admission medications  Medication Sig Start Date End Date Taking? Authorizing Provider  levothyroxine  (SYNTHROID ) 75 MCG tablet Take 1 tablet (75 mcg total) by mouth daily before breakfast. TAKE ON AN EMPTY STOMACH 2 HOURS AWAY FROM OTHER MEDICATIONS 09/25/24   Ngetich, Dinah C, NP  losartan  (COZAAR ) 50 MG tablet Take 1 tablet (50 mg total) by mouth daily. Needs an appointment before anymore future refills. 09/25/24   Ngetich, Dinah C, NP  Semaglutide ,0.25 or 0.5MG /DOS, (OZEMPIC , 0.25 OR 0.5 MG/DOSE,) 2 MG/3ML SOPN Inject 0.25 mg into the skin once a week. 11/12/24   Ngetich, Dinah C, NP  simvastatin  (ZOCOR ) 20 MG tablet Take 1 tablet (20 mg total) by mouth at bedtime. E78.2 09/25/24   Ngetich, Dinah C, NP  amLODipine  (NORVASC ) 5 MG tablet Take 1 tablet (5 mg total) by mouth daily. Patient not taking: Reported on 04/07/2021 07/08/18 04/07/21  Little, Vernell Search, MD  valsartan (DIOVAN) 160 MG tablet Take 160 mg by mouth daily. Patient not taking: Reported on 04/07/2021 01/11/21 04/07/21  [provider]    Allergies: Patient has no known allergies.    Review of Systems  All other systems reviewed and are negative.   Updated Vital  Signs BP (!) 176/107   Pulse 86   Temp 98.6 F (37 C) (Oral)   Resp 16   Ht 5' 6 (1.676 m)   Wt (!) 167.8 kg   SpO2 99%   BMI 59.72 kg/m   Physical Exam Vitals and nursing note reviewed.  Constitutional:      General: She is not in acute distress.    Appearance: She is well-developed. She is not diaphoretic.  HENT:     Head: Normocephalic and atraumatic.  Eyes:     Extraocular Movements: Extraocular movements intact.     Pupils: Pupils are equal, round, and reactive to light.  Cardiovascular:     Rate and Rhythm: Normal rate and regular rhythm.     Heart sounds: No murmur heard.    No friction rub. No gallop.  Pulmonary:     Effort: Pulmonary effort is normal. No respiratory distress.     Breath sounds: Normal breath sounds. No wheezing.  Abdominal:     General: Bowel sounds are normal. There is no distension.     Palpations: Abdomen is soft.     Tenderness: There is no abdominal tenderness.  Musculoskeletal:        General: Normal range of motion.     Cervical back: Normal range of motion and neck supple.  Skin:    General: Skin is warm and dry.  Neurological:     General: No focal deficit present.     Mental Status: She  is alert and oriented to person, place, and time.     Cranial Nerves: No cranial nerve deficit.     Motor: No weakness.     (all labs ordered are listed, but only abnormal results are displayed) Labs Reviewed - No data to display  EKG: EKG Interpretation Date/Time:  Thursday December 25 2024 04:15:15 EST Ventricular Rate:  78 PR Interval:  197 QRS Duration:  81 QT Interval:  359 QTC Calculation: 409 R Axis:   14  Text Interpretation: Sinus rhythm Low voltage, precordial leads Confirmed by Geroldine Berg ([id number]) on 12/25/2024 4:45:25 AM  Radiology: No results found.   Procedures   Medications Ordered in the ED  cloNIDine  (CATAPRES ) tablet 0.1 mg (has no administration in time range)                                    Medical  Decision Making Amount and/or Complexity of Data Reviewed Labs: ordered.  Risk Prescription drug management.   Patient is a 39 year old female presenting with elevated blood pressure.  She arrives here with stable vital signs and is afebrile.  Initial blood pressure 186/108.  Physical examination is unremarkable and she is neurologically intact.  Laboratory studies obtained including CBC and basic metabolic panel, both of which are unremarkable.  Patient given a 0.1 mg dose of clonidine  with good results.  At the time of this dictation her blood pressure is 139/80.  I will advise her to increase her dose of losartan  if her blood pressures continue to run high.  To return as needed for any problems.     Final diagnoses:  None    ED Discharge Orders     None          Geroldine Berg, MD 12/25/24 [phone]  "

## 2025-02-06 ENCOUNTER — Ambulatory Visit: Payer: PRIVATE HEALTH INSURANCE | Admitting: Family
